# Patient Record
Sex: Female | Born: 1990 | Race: White | Hispanic: No | Smoking: Former smoker
Health system: Southern US, Community
[De-identification: ages and names within clinical notes are randomized; demographics above are authoritative.]

## PROBLEM LIST (undated history)

## (undated) DIAGNOSIS — I739 Peripheral vascular disease, unspecified: Secondary | ICD-10-CM

## (undated) HISTORY — DX: Peripheral vascular disease, unspecified: I73.9

## (undated) HISTORY — PX: TONSILLECTOMY: SUR1361

## (undated) HISTORY — PX: CHOLECYSTECTOMY: SHX55

---

## 2018-07-12 ENCOUNTER — Encounter (HOSPITAL_COMMUNITY): Payer: Self-pay

## 2018-07-12 ENCOUNTER — Emergency Department (HOSPITAL_COMMUNITY)
Admission: EM | Admit: 2018-07-12 | Discharge: 2018-07-12 | Disposition: A | Discharge status: Home / Self Care | Payer: Self-pay | Attending: Emergency Medicine | Admitting: Emergency Medicine

## 2018-07-12 DIAGNOSIS — J209 Acute bronchitis, unspecified: Secondary | ICD-10-CM | POA: Insufficient documentation

## 2018-07-12 DIAGNOSIS — F17200 Nicotine dependence, unspecified, uncomplicated: Secondary | ICD-10-CM | POA: Insufficient documentation

## 2018-07-12 MED ORDER — ALBUTEROL SULFATE HFA 108 (90 BASE) MCG/ACT IN AERS
1.0000 | INHALATION_SPRAY | Freq: Once | RESPIRATORY_TRACT | Status: AC
  Administered 2018-07-12: 2 via RESPIRATORY_TRACT
  Filled 2018-07-12: qty 6.7

## 2018-07-12 MED ORDER — DM-GUAIFENESIN ER 30-600 MG PO TB12: 1.0000 | ORAL_TABLET | Freq: Two times a day (BID) | ORAL | 0 refills | Status: DC

## 2018-07-12 MED ORDER — PREDNISONE 20 MG PO TABS: 40.0000 mg | ORAL_TABLET | Freq: Every day | ORAL | 0 refills | Status: DC

## 2018-07-12 NOTE — ED Provider Notes (Signed)
[facility] EMERGENCY DEPARTMENT Provider Note   CSN: [id number] Arrival date & time: 07/12/18  1849     History   Chief Complaint Chief Complaint  Patient presents with  . Illness    HPI Amy Decker is a 28 y.o. female who presents with a cough.  Past medical history significant for tobacco use.  Patient states that for the past week she has had URI symptoms and a fever.  The fever has resolved however she still reports a significant cough.  Today she coughed for "5 minutes straight" and could not catch her breath.  She called her mom and her mom urged her to come to the emergency department to be checked out.  She has been coughing up green sputum.  She denies ear pain, runny nose, sore throat, chest pain, shortness of breath, wheezing.  She has had bronchitis in the past and it feels similar.  She reports being prescribed promethazine for bronchitis in the past.  HPI  No past medical history on file.  There are no active problems to display for this patient.    The histories are not reviewed yet. Please review them in the "History" navigator section and refresh this SmartLink.   OB History   No obstetric history on file.      Home Medications    Prior to Admission medications   Medication Sig Start Date End Date Taking? Authorizing Provider  dextromethorphan-guaiFENesin (MUCINEX DM) 30-600 MG 12hr tablet Take 1 tablet by mouth 2 (two) times daily. 07/12/18   Bethel Born, PA-C  predniSONE (DELTASONE) 20 MG tablet Take 2 tablets (40 mg total) by mouth daily. 07/12/18   Bethel Born, PA-C    Family History No family history on file.  Social History Social History   Tobacco Use  . Smoking status: Current Every Day Smoker  . Smokeless tobacco: Never Used  Substance Use Topics  . Alcohol use: Never    Frequency: Never  . Drug use: Never     Allergies   Patient has no known allergies.   Review of Systems Review of  Systems  Constitutional: Positive for fever (Resolved).  HENT: Positive for congestion (Chest). Negative for ear pain, sinus pain and sore throat.   Respiratory: Positive for cough. Negative for shortness of breath and wheezing.   Cardiovascular: Negative for chest pain.  Gastrointestinal: Negative for vomiting.     Physical Exam Updated Vital Signs BP 112/75   Pulse 77   Temp 98.2 F (36.8 C) (Oral)   Resp 16   LMP 06/24/2018   SpO2 97%   Physical Exam Vitals signs and nursing note reviewed.  Constitutional:      General: She is not in acute distress.    Appearance: Normal appearance. She is well-developed.     Comments: Calm and cooperative.  Hoarse voice  HENT:     Head: Normocephalic and atraumatic.     Mouth/Throat:     Comments: Poor dentition Eyes:     General: No scleral icterus.       Right eye: No discharge.        Left eye: No discharge.     Conjunctiva/sclera: Conjunctivae normal.     Pupils: Pupils are equal, round, and reactive to light.  Neck:     Musculoskeletal: Normal range of motion.  Cardiovascular:     Rate and Rhythm: Normal rate and regular rhythm.  Pulmonary:     Effort: Pulmonary effort is normal. No  respiratory distress.     Breath sounds: Normal breath sounds.  Abdominal:     General: There is no distension.  Skin:    General: Skin is warm and dry.  Neurological:     Mental Status: She is alert and oriented to person, place, and time.  Psychiatric:        Behavior: Behavior normal.      ED Treatments / Results  Labs (all labs ordered are listed, but only abnormal results are displayed) Labs Reviewed - No data to display  EKG None  Radiology No results found.  Procedures Procedures (including critical care time)  Medications Ordered in ED Medications  albuterol (PROVENTIL HFA;VENTOLIN HFA) 108 (90 Base) MCG/ACT inhaler 1-2 puff (2 puffs Inhalation Given 07/12/18 1934)     Initial Impression / Assessment and Plan / ED  Course  I have reviewed the triage vital signs and the nursing notes.  Pertinent labs & imaging results that were available during my care of the patient were reviewed by me and considered in my medical decision making (see chart for details).  28 year old female presents with cough.  She states that overall she feels well other than the severe cough.  Her vital signs are normal.  On exam her heart is regular rate and rhythm.  Lungs are clear to auscultation.  I do not think she needs a chest x-ray at this time.  We will treat for viral bronchitis with steroid, cough medicine and albuterol.  She was given return precautions if she is not improving.  Final Clinical Impressions(s) / ED Diagnoses   Final diagnoses:  Acute bronchitis, unspecified organism    ED Discharge Orders         Ordered    predniSONE (DELTASONE) 20 MG tablet  Daily     07/12/18 1932    dextromethorphan-guaiFENesin (MUCINEX DM) 30-600 MG 12hr tablet  2 times daily     07/12/18 1932           Bethel BornGekas, Kelly Marie, PA-C 07/12/18 1939    Sabas SousBero, Michael M, MD 07/13/18 1036

## 2018-07-12 NOTE — Discharge Instructions (Addendum)
Use inhaler as needed for shortness of breath and wheezing Take Prednisone for the next 5 days Take cough medicine as directed Return if worsening

## 2018-07-12 NOTE — ED Notes (Signed)
Pt stable, ambulatory, states understanding of discharge instructions 

## 2018-07-12 NOTE — ED Triage Notes (Signed)
Pt c/o cough with green sputum x1 week.

## 2018-07-25 ENCOUNTER — Emergency Department (HOSPITAL_COMMUNITY): Payer: Self-pay

## 2018-07-25 ENCOUNTER — Other Ambulatory Visit: Payer: Self-pay

## 2018-07-25 ENCOUNTER — Encounter (HOSPITAL_COMMUNITY): Payer: Self-pay

## 2018-07-25 ENCOUNTER — Emergency Department (HOSPITAL_COMMUNITY)
Admission: EM | Admit: 2018-07-25 | Discharge: 2018-07-25 | Disposition: A | Discharge status: Home / Self Care | Payer: Self-pay | Attending: Emergency Medicine | Admitting: Emergency Medicine

## 2018-07-25 DIAGNOSIS — J40 Bronchitis, not specified as acute or chronic: Secondary | ICD-10-CM | POA: Insufficient documentation

## 2018-07-25 DIAGNOSIS — F17200 Nicotine dependence, unspecified, uncomplicated: Secondary | ICD-10-CM | POA: Insufficient documentation

## 2018-07-25 MED ORDER — DOXYCYCLINE HYCLATE 100 MG PO CAPS: 100.0000 mg | ORAL_CAPSULE | Freq: Two times a day (BID) | ORAL | 0 refills | Status: DC

## 2018-07-25 NOTE — ED Triage Notes (Signed)
Pt reports continued productive cough for the past 2 weeks. SOB on exertion.

## 2018-07-25 NOTE — ED Notes (Signed)
ED Provider at bedside. 

## 2018-07-25 NOTE — ED Provider Notes (Signed)
[facility] EMERGENCY DEPARTMENT Provider Note   CSN: [id number] Arrival date & time: 07/25/18  1716     History   Chief Complaint Chief Complaint  Patient presents with  . Cough    HPI Amy Decker is a 28 y.o. female.  HPI 28 year old female presents today complaining of 2 weeks of cough with production of greenish colored sputum.  She states that she was seen here 2 weeks ago and was told she had bronchitis.  She was placed on prednisone given albuterol inhaler and advised to stop smoking.  She has stopped cigarette smoking but is started vaping.  She is continued to have cough productive of greenish sputum and has some shortness of breath associated with this.  She has not had any fever or chills.  She has been eating and drinking as per usual.  She presents today for reassessment given that her symptoms have been ongoing. History reviewed. No pertinent past medical history.  There are no active problems to display for this patient.   Past Surgical History:  Procedure Laterality Date  . CHOLECYSTECTOMY    . TONSILLECTOMY       OB History   No obstetric history on file.      Home Medications    Prior to Admission medications   Medication Sig Start Date End Date Taking? Authorizing Provider  dextromethorphan-guaiFENesin (MUCINEX DM) 30-600 MG 12hr tablet Take 1 tablet by mouth 2 (two) times daily. 07/12/18   Bethel Born, PA-C  predniSONE (DELTASONE) 20 MG tablet Take 2 tablets (40 mg total) by mouth daily. 07/12/18   Bethel Born, PA-C    Family History No family history on file.  Social History Social History   Tobacco Use  . Smoking status: Current Every Day Smoker  . Smokeless tobacco: Never Used  Substance Use Topics  . Alcohol use: Never    Frequency: Never  . Drug use: Never     Allergies   Patient has no known allergies.   Review of Systems Review of Systems  All other systems reviewed and are  negative.    Physical Exam Updated Vital Signs BP 108/70 (BP Location: Right Arm)   Pulse 71   Temp 97.7 F (36.5 C) (Oral)   Resp 18   LMP 07/20/2018 (Exact Date)   SpO2 92%   Physical Exam Vitals signs and nursing note reviewed.  Constitutional:      Appearance: Normal appearance. She is normal weight.  HENT:     Head: Normocephalic and atraumatic.     Right Ear: Tympanic membrane normal.     Left Ear: Tympanic membrane normal.     Nose: Nose normal.     Mouth/Throat:     Mouth: Mucous membranes are dry.  Eyes:     Pupils: Pupils are equal, round, and reactive to light.  Neck:     Musculoskeletal: Normal range of motion.  Cardiovascular:     Rate and Rhythm: Normal rate.     Pulses: Normal pulses.  Pulmonary:     Effort: Pulmonary effort is normal.     Breath sounds: Normal breath sounds. No wheezing or rhonchi.  Abdominal:     General: Abdomen is flat. Bowel sounds are normal.  Musculoskeletal: Normal range of motion.  Skin:    General: Skin is warm and dry.  Neurological:     General: No focal deficit present.     Mental Status: She is alert.  Psychiatric:  Mood and Affect: Mood normal.      ED Treatments / Results  Labs (all labs ordered are listed, but only abnormal results are displayed) Labs Reviewed - No data to display  EKG None  Radiology Dg Chest 2 View  Result Date: 07/25/2018 CLINICAL DATA:  Cough for 10 days. EXAM: CHEST - 2 VIEW COMPARISON:  None. FINDINGS: Cardiomediastinal silhouette is normal. Mediastinal contours appear intact. There is no evidence of focal airspace consolidation, pleural effusion or pneumothorax. Osseous structures are without acute abnormality. Soft tissues are grossly normal. IMPRESSION: No active cardiopulmonary disease. Electronically Signed   By: Ted Mcalpineobrinka  Dimitrova M.D.   On: 07/25/2018 18:28    Procedures Procedures (including critical care time)  Medications Ordered in ED Medications - No data to  display   Initial Impression / Assessment and Plan / ED Course  I have reviewed the triage vital signs and the nursing notes.  Pertinent labs & imaging results that were available during my care of the patient were reviewed by me and considered in my medical decision making (see chart for details).    28 year old female with ongoing productive cough for several weeks.  Chest x-Tamari Redwine is clear.  Will treat with doxycycline. Final Clinical Impressions(s) / ED Diagnoses   Final diagnoses:  Bronchitis    ED Discharge Orders    None       Margarita Grizzleay, Amy Putz, MD 07/25/18 1843

## 2019-03-30 ENCOUNTER — Ambulatory Visit (INDEPENDENT_AMBULATORY_CARE_PROVIDER_SITE_OTHER): Payer: Medicaid Other

## 2019-03-30 VITALS — Ht 65.0 in

## 2019-03-30 DIAGNOSIS — Z3480 Encounter for supervision of other normal pregnancy, unspecified trimester: Secondary | ICD-10-CM | POA: Insufficient documentation

## 2019-03-30 MED ORDER — BLOOD PRESSURE KIT DEVI: 1.0000 | 0 refills | Status: DC

## 2019-03-30 NOTE — Progress Notes (Signed)
I connected with  Amy Decker on 03/30/19 by a video enabled telemedicine application and verified that I am speaking with the correct person using two identifiers.    Virtual Visit via Telephone Note  I connected with Amy Decker on 03/30/19 at  8:45 AM EDT by telephone and verified that I am speaking with the correct person using two identifiers.  Location: [facility] Patient: Amy Decker  I discussed the limitations, risks, security and privacy concerns of performing an evaluation and management service by telephone and the availability of in person appointments. I also discussed with the patient that there may be a patient responsible charge related to this service. The patient expressed understanding and agreed to proceed.   History of Present Illness: PRENATAL INTAKE SUMMARY  Amy Decker presents today New OB Nurse Interview.  OB History    Gravida  3   Para  2   Term  2   Preterm      AB      Living  2     SAB      TAB      Ectopic      Multiple      Live Births             I have reviewed the patient's medical, obstetrical, social, and family histories, medications, and available lab results.  SUBJECTIVE She has no unusual complaints   Observations/Objective: Initial nurse interview for history/labs (New OB)  EDD: 08/07/2019 GA: [redacted]w[redacted]d G3 P2   GENERAL APPEARANCE: alert, well appearing  Assessment and Plan: Normal pregnancy Prenatal care  Follow Up Instructions:   I discussed the assessment and treatment plan with the patient. The patient was provided an opportunity to ask questions and all were answered. The patient agreed with the plan and demonstrated an understanding of the instructions.   The patient was advised to call back or seek an in-person evaluation if the symptoms worsen or if the condition fails to improve as anticipated.  I provided 15 minutes of non-face-to-face time during this encounter.   Amy Decker,  RMA

## 2019-04-06 ENCOUNTER — Other Ambulatory Visit: Payer: Self-pay

## 2019-04-06 ENCOUNTER — Other Ambulatory Visit (HOSPITAL_COMMUNITY)
Admission: RE | Admit: 2019-04-06 | Discharge: 2019-04-06 | Disposition: A | Discharge status: Home / Self Care | Payer: Medicaid Other | Source: Ambulatory Visit | Attending: Obstetrics and Gynecology | Admitting: Obstetrics and Gynecology

## 2019-04-06 ENCOUNTER — Ambulatory Visit (INDEPENDENT_AMBULATORY_CARE_PROVIDER_SITE_OTHER): Payer: Medicaid Other | Admitting: Obstetrics and Gynecology

## 2019-04-06 ENCOUNTER — Encounter: Payer: Self-pay | Admitting: Obstetrics and Gynecology

## 2019-04-06 DIAGNOSIS — Z3480 Encounter for supervision of other normal pregnancy, unspecified trimester: Secondary | ICD-10-CM | POA: Insufficient documentation

## 2019-04-06 DIAGNOSIS — O0932 Supervision of pregnancy with insufficient antenatal care, second trimester: Secondary | ICD-10-CM | POA: Diagnosis not present

## 2019-04-06 DIAGNOSIS — O0930 Supervision of pregnancy with insufficient antenatal care, unspecified trimester: Secondary | ICD-10-CM | POA: Insufficient documentation

## 2019-04-06 DIAGNOSIS — Z3A22 22 weeks gestation of pregnancy: Secondary | ICD-10-CM

## 2019-04-06 NOTE — Patient Instructions (Signed)

## 2019-04-06 NOTE — Progress Notes (Signed)
Pt presents for NOB. Flu vaccine offered; pt declined Pap due today

## 2019-04-06 NOTE — Progress Notes (Signed)
Subjective:  Amy Decker is a 28 y.o. G3P2002 at [redacted]w[redacted]d being seen today for her first OB visit. EDD by LMP. No chronic medical problems or medications. 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    She is currently monitored for the following issues for this low-risk pregnancy and has Supervision of other normal pregnancy, antepartum and Late prenatal care affecting pregnancy on their problem list.  Patient reports no complaints.  Contractions: Not present. Vag. Bleeding: None.  Movement: Present. Denies leaking of fluid.   The following portions of the patient's history were reviewed and updated as appropriate: allergies, current medications, past family history, past medical history, past social history, past surgical history and problem list. Problem list updated.  Objective:   Vitals:   04/06/19 1449  BP: 125/70  Pulse: 88  Temp: 98.9 F (37.2 C)  Weight: 120 lb (54.4 kg)    Fetal Status: Fetal Heart Rate (bpm): 145   Movement: Present     General:  Alert, oriented and cooperative. Patient is in no acute distress.  Skin: Skin is warm and dry. No rash noted.   Cardiovascular: Normal heart rate noted  Respiratory: Normal respiratory effort, no problems with respiration noted  Abdomen: Soft, gravid, appropriate for gestational age. Pain/Pressure: Present     Pelvic:  Cervical exam performed        Extremities: Normal range of motion.  Edema: None  Mental Status: Normal mood and affect. Normal behavior. Normal judgment and thought content.  Breast sym supple no masses nipple discharge or adenopathy  Urinalysis:      Assessment and Plan:  Pregnancy: G3P2002 at [redacted]w[redacted]d  1. Supervision of other normal pregnancy, antepartum Stable Prenatal care and labs reviewed with pt Anatomy scan ordered Glucola next visit  2. Late prenatal care affecting pregnancy in second trimester   Preterm labor symptoms and general obstetric precautions including but not limited to vaginal bleeding,  contractions, leaking of fluid and fetal movement were reviewed in detail with the patient. Please refer to After Visit Summary for other counseling recommendations.  Return in about 4 weeks (around 05/04/2019) for OB visit, face to face for glucola.   Chancy Milroy, MD

## 2019-04-08 LAB — OBSTETRIC PANEL, INCLUDING HIV
Antibody Screen: NEGATIVE
Basophils Absolute: 0 10*3/uL (ref 0.0–0.2)
Basos: 0 %
EOS (ABSOLUTE): 0.1 10*3/uL (ref 0.0–0.4)
Eos: 1 %
HIV Screen 4th Generation wRfx: NONREACTIVE
Hematocrit: 31.2 % — ABNORMAL LOW (ref 34.0–46.6)
Hemoglobin: 10.7 g/dL — ABNORMAL LOW (ref 11.1–15.9)
Hepatitis B Surface Ag: NEGATIVE
Immature Grans (Abs): 0.1 10*3/uL (ref 0.0–0.1)
Immature Granulocytes: 1 %
Lymphocytes Absolute: 1.4 10*3/uL (ref 0.7–3.1)
Lymphs: 13 %
MCH: 32.4 pg (ref 26.6–33.0)
MCHC: 34.3 g/dL (ref 31.5–35.7)
MCV: 95 fL (ref 79–97)
Monocytes Absolute: 0.6 10*3/uL (ref 0.1–0.9)
Monocytes: 5 %
Neutrophils Absolute: 8.1 10*3/uL — ABNORMAL HIGH (ref 1.4–7.0)
Neutrophils: 80 %
Platelets: 171 10*3/uL (ref 150–450)
RBC: 3.3 x10E6/uL — ABNORMAL LOW (ref 3.77–5.28)
RDW: 12.5 % (ref 11.7–15.4)
RPR Ser Ql: NONREACTIVE
Rh Factor: POSITIVE
Rubella Antibodies, IGG: 1.61 index (ref >0.99)
WBC: 10.3 10*3/uL (ref 3.4–10.8)

## 2019-04-08 LAB — URINE CULTURE, OB REFLEX: Organism ID, Bacteria: NO GROWTH

## 2019-04-08 LAB — CULTURE, OB URINE

## 2019-04-13 ENCOUNTER — Encounter: Payer: Self-pay | Admitting: Obstetrics and Gynecology

## 2019-04-13 ENCOUNTER — Other Ambulatory Visit: Payer: Self-pay | Admitting: Obstetrics and Gynecology

## 2019-04-13 ENCOUNTER — Other Ambulatory Visit: Payer: Self-pay

## 2019-04-13 ENCOUNTER — Ambulatory Visit (HOSPITAL_COMMUNITY)
Admission: RE | Admit: 2019-04-13 | Discharge: 2019-04-13 | Disposition: A | Discharge status: Home / Self Care | Payer: Medicaid Other | Source: Ambulatory Visit | Attending: Obstetrics and Gynecology | Admitting: Obstetrics and Gynecology

## 2019-04-13 DIAGNOSIS — Z3A23 23 weeks gestation of pregnancy: Secondary | ICD-10-CM | POA: Diagnosis not present

## 2019-04-13 DIAGNOSIS — Z3480 Encounter for supervision of other normal pregnancy, unspecified trimester: Secondary | ICD-10-CM | POA: Insufficient documentation

## 2019-04-13 DIAGNOSIS — O0932 Supervision of pregnancy with insufficient antenatal care, second trimester: Secondary | ICD-10-CM

## 2019-04-13 DIAGNOSIS — O43192 Other malformation of placenta, second trimester: Secondary | ICD-10-CM

## 2019-04-13 LAB — CYTOLOGY - PAP
Adequacy: ABSENT
Diagnosis: NEGATIVE
High risk HPV: NEGATIVE

## 2019-04-15 LAB — CERVICOVAGINAL ANCILLARY ONLY
Bacterial Vaginitis (gardnerella): NEGATIVE
Candida Glabrata: NEGATIVE
Candida Vaginitis: NEGATIVE
Chlamydia: NEGATIVE
Comment: NEGATIVE
Comment: NEGATIVE
Comment: NEGATIVE
Comment: NEGATIVE
Comment: NEGATIVE
Comment: NORMAL
Neisseria Gonorrhea: NEGATIVE
Trichomonas: NEGATIVE

## 2019-04-28 ENCOUNTER — Encounter: Payer: Self-pay | Admitting: Obstetrics and Gynecology

## 2019-05-03 ENCOUNTER — Other Ambulatory Visit: Payer: Self-pay

## 2019-05-03 ENCOUNTER — Emergency Department (HOSPITAL_COMMUNITY)
Admission: EM | Admit: 2019-05-03 | Discharge: 2019-05-03 | Disposition: A | Discharge status: Home / Self Care | Payer: Medicaid Other | Attending: Emergency Medicine | Admitting: Emergency Medicine

## 2019-05-03 ENCOUNTER — Encounter (HOSPITAL_COMMUNITY): Payer: Self-pay | Admitting: Emergency Medicine

## 2019-05-03 DIAGNOSIS — Z79899 Other long term (current) drug therapy: Secondary | ICD-10-CM | POA: Insufficient documentation

## 2019-05-03 DIAGNOSIS — F17210 Nicotine dependence, cigarettes, uncomplicated: Secondary | ICD-10-CM | POA: Insufficient documentation

## 2019-05-03 DIAGNOSIS — K0889 Other specified disorders of teeth and supporting structures: Secondary | ICD-10-CM | POA: Insufficient documentation

## 2019-05-03 MED ORDER — PENICILLIN V POTASSIUM 500 MG PO TABS: 500.0000 mg | ORAL_TABLET | Freq: Four times a day (QID) | ORAL | 0 refills | Status: AC

## 2019-05-03 NOTE — ED Notes (Signed)
PT refused last vitals  Patient verbalizes understanding of discharge instructions. Opportunity for questioning and answers were provided. Armband removed by staff, pt discharged from ED

## 2019-05-03 NOTE — ED Triage Notes (Signed)
Pt states she has terrible dental pain since yesterday. But cannot tell me which tooth. Just states its all over the right side. Pt states she has "bad teeth."

## 2019-05-03 NOTE — Discharge Instructions (Signed)
You were given a prescription for antibiotics. Please take the antibiotic prescription fully.   Please follow-up with a dentist in the next 5 to 7 days for reevaluation.  If you do not have a dentist, resources were provided for dentist in the area in your discharge summary.  Please contact one of the offices that are listed and make an appointment for follow-up.  Please return to the emergency department for any new or worsening symptoms.  

## 2019-05-03 NOTE — ED Provider Notes (Signed)
[place] EMERGENCY DEPARTMENT Provider Note   CSN: [id number] Arrival date & time: 05/03/19  1222     History   Chief Complaint Chief Complaint  Patient presents with  . Dental Pain    HPI Amy Decker is a 28 y.o. female.     HPI   Patient is a 28 year old female who presents the emergency department today for evaluation of right upper dental pain that started yesterday.  Pain is constant and severe in nature.  Denies exacerbating or alleviating factors.  She has not had a fever.  Past Medical History:  Diagnosis Date  . Peripheral vascular disease [facility])     Patient Active Problem List   Diagnosis Date Noted  . Late prenatal care affecting pregnancy 04/06/2019  . Supervision of other normal pregnancy, antepartum 03/30/2019    Past Surgical History:  Procedure Laterality Date  . CHOLECYSTECTOMY    . TONSILLECTOMY       OB History    Gravida  3   Para  2   Term  2   Preterm      AB      Living  2     SAB      TAB      Ectopic      Multiple      Live Births  2            Home Medications    Prior to Admission medications   Medication Sig Start Date End Date Taking? Authorizing Provider  Blood Pressure Monitoring (BLOOD PRESSURE KIT) DEVI 1 kit by Does not apply route once a week. Check BP regularly. Large Cuff Patient not taking: Reported on 04/06/2019 03/30/19   Shelly Bombard, MD  dextromethorphan-guaiFENesin [facility] DM) 30-600 MG 12hr tablet Take 1 tablet by mouth 2 (two) times daily. Patient not taking: Reported on 03/30/2019 07/12/18   Recardo Evangelist, PA-C  omeprazole (PRILOSEC) 10 MG capsule Take 10 mg by mouth daily.    [provider]  penicillin v potassium (VEETID) 500 MG tablet Take 1 tablet (500 mg total) by mouth 4 (four) times daily for 7 days. 05/03/19 05/10/19  Patches Mcdonnell S, PA-C  predniSONE (DELTASONE) 20 MG tablet Take 2 tablets (40 mg total) by mouth daily. Patient not  taking: Reported on 03/30/2019 07/12/18   Recardo Evangelist, PA-C    Family History Family History  Problem Relation Age of Onset  . Arthritis Mother   . Cancer Mother   . Hypertension Maternal Grandfather     Social History Social History   Tobacco Use  . Smoking status: Current Every Day Smoker    Types: Cigarettes  . Smokeless tobacco: Never Used  Substance Use Topics  . Alcohol use: Never    Frequency: Never  . Drug use: Never     Allergies   Patient has no known allergies.   Review of Systems Review of Systems  Constitutional: Negative for fever.  HENT: Positive for dental problem.      Physical Exam Updated Vital Signs BP 109/68 (BP Location: Right Arm)   Temp 98.2 F (36.8 C) (Oral)   Resp 14   LMP 10/31/2018 (Approximate)   SpO2 98%   Physical Exam Vitals signs and nursing note reviewed.  Constitutional:      General: She is not in acute distress.    Appearance: She is well-developed.  HENT:     Head: Normocephalic and atraumatic.     Mouth/Throat:  Comments: Multiple missing teeth and dental caries.  Right upper molar is fractured and tender to percussion.  There is no periapical abscess.  No sublingual swelling.  No submandibular swelling.  No trismus. Eyes:     Conjunctiva/sclera: Conjunctivae normal.  Neck:     Musculoskeletal: Neck supple.  Cardiovascular:     Rate and Rhythm: Normal rate.  Pulmonary:     Effort: Pulmonary effort is normal.  Musculoskeletal: Normal range of motion.  Skin:    General: Skin is warm and dry.  Neurological:     Mental Status: She is alert.      ED Treatments / Results  Labs (all labs ordered are listed, but only abnormal results are displayed) Labs Reviewed - No data to display  EKG None  Radiology No results found.  Procedures Procedures (including critical care time)  Medications Ordered in ED Medications - No data to display   Initial Impression / Assessment and Plan / ED Course   I have reviewed the triage vital signs and the nursing notes.  Pertinent labs & imaging results that were available during my care of the patient were reviewed by me and considered in my medical decision making (see chart for details).    Final Clinical Impressions(s) / ED Diagnoses   Final diagnoses:  Pain, dental   Patient with toothache.  No gross abscess.  Exam unconcerning for Ludwig's angina or spread of infection.  Will treat with penicillin and pain medicine.  Urged patient to follow-up with dentist.     ED Discharge Orders         Ordered    penicillin v potassium (VEETID) 500 MG tablet  4 times daily     05/03/19 [address] [place], PA-C 05/03/19 1415    Charlesetta Shanks, MD 05/04/19 1620

## 2019-05-04 ENCOUNTER — Encounter: Payer: Medicaid Other | Admitting: Obstetrics and Gynecology

## 2019-05-04 ENCOUNTER — Other Ambulatory Visit: Payer: Medicaid Other

## 2019-05-07 ENCOUNTER — Other Ambulatory Visit: Payer: Self-pay

## 2019-05-07 ENCOUNTER — Encounter: Payer: Self-pay | Admitting: Obstetrics

## 2019-05-07 ENCOUNTER — Other Ambulatory Visit: Payer: Medicaid Other

## 2019-05-07 ENCOUNTER — Encounter: Payer: Self-pay | Admitting: Women's Health

## 2019-05-07 ENCOUNTER — Ambulatory Visit (INDEPENDENT_AMBULATORY_CARE_PROVIDER_SITE_OTHER): Payer: Medicaid Other | Admitting: Women's Health

## 2019-05-07 DIAGNOSIS — O43192 Other malformation of placenta, second trimester: Secondary | ICD-10-CM

## 2019-05-07 DIAGNOSIS — Z3480 Encounter for supervision of other normal pregnancy, unspecified trimester: Secondary | ICD-10-CM

## 2019-05-07 DIAGNOSIS — Z3A26 26 weeks gestation of pregnancy: Secondary | ICD-10-CM

## 2019-05-07 DIAGNOSIS — O43199 Other malformation of placenta, unspecified trimester: Secondary | ICD-10-CM | POA: Insufficient documentation

## 2019-05-07 NOTE — Progress Notes (Signed)
Subjective:  Amy Decker is a 28 y.o. G3P2002 at [redacted]w[redacted]d being seen today for ongoing prenatal care.  She is currently monitored for the following issues for this low-risk pregnancy and has Supervision of other normal pregnancy, antepartum; Late prenatal care affecting pregnancy; and Marginal insertion of umbilical cord affecting management of mother on their problem list.  Patient reports no complaints.  Contractions: Not present. Vag. Bleeding: None.  Movement: Present. Denies leaking of fluid.   The following portions of the patient's history were reviewed and updated as appropriate: allergies, current medications, past family history, past medical history, past social history, past surgical history and problem list. Problem list updated.  Objective:   Vitals:   05/07/19 0813  BP: 102/63  Pulse: 94  Weight: 121 lb (54.9 kg)    Fetal Status: Fetal Heart Rate (bpm): 138 Fundal Height: 26 cm Movement: Present     General:  Alert, oriented and cooperative. Patient is in no acute distress.  Skin: Skin is warm and dry. No rash noted.   Cardiovascular: Normal heart rate noted  Respiratory: Normal respiratory effort, no problems with respiration noted  Abdomen: Soft, gravid, appropriate for gestational age. Pain/Pressure: Absent     Pelvic: Vag. Bleeding: None     Cervical exam deferred        Extremities: Normal range of motion.     Mental Status: Normal mood and affect. Normal behavior. Normal judgment and thought content.   Assessment and Plan:  Pregnancy: G3P2002 at [redacted]w[redacted]d  1. Supervision of other normal pregnancy, antepartum - pt not fasting for 2hr GTT this morning, will reschedule ASAP next week for labs/glucose/Tdap - marginal cord insertion found on anatomy scan, f/u scan 05/12/2019, pt aware  Preterm labor symptoms and general obstetric precautions including but not limited to vaginal bleeding, contractions, leaking of fluid and fetal movement were reviewed in detail  with the patient. Please refer to After Visit Summary for other counseling recommendations.  Return in about 1 week (around 05/14/2019) for GTT/labs/Tdap ASAP, 2 weeks virtual ROB.   Earnesteen Birnie, Gerrie Nordmann, NP

## 2019-05-07 NOTE — Patient Instructions (Addendum)
Preterm Labor and Birth Information ° °The normal length of a pregnancy is 39-41 weeks. Preterm labor is when labor starts before 37 completed weeks of pregnancy. °What are the risk factors for preterm labor? °Preterm labor is more likely to occur in women who: °· Have certain infections during pregnancy such as a bladder infection, sexually transmitted infection, or infection inside the uterus (chorioamnionitis). °· Have a shorter-than-normal cervix. °· Have gone into preterm labor before. °· Have had surgery on their cervix. °· Are younger than age 17 or older than age 35. °· Are African American. °· Are pregnant with twins or multiple babies (multiple gestation). °· Take street drugs or smoke while pregnant. °· Do not gain enough weight while pregnant. °· Became pregnant shortly after having been pregnant. °What are the symptoms of preterm labor? °Symptoms of preterm labor include: °· Cramps similar to those that can happen during a menstrual period. The cramps may happen with diarrhea. °· Pain in the abdomen or lower back. °· Regular uterine contractions that may feel like tightening of the abdomen. °· A feeling of increased pressure in the pelvis. °· Increased watery or bloody mucus discharge from the vagina. °· Water breaking (ruptured amniotic sac). °Why is it important to recognize signs of preterm labor? °It is important to recognize signs of preterm labor because babies who are born prematurely may not be fully developed. This can put them at an increased risk for: °· Long-term (chronic) heart and lung problems. °· Difficulty immediately after birth with regulating body systems, including blood sugar, body temperature, heart rate, and breathing rate. °· Bleeding in the brain. °· Cerebral palsy. °· Learning difficulties. °· Death. °These risks are highest for babies who are born before 34 weeks of pregnancy. °How is preterm labor treated? °Treatment depends on the length of your pregnancy, your condition,  and the health of your baby. It may involve: °· Having a stitch (suture) placed in your cervix to prevent your cervix from opening too early (cerclage). °· Taking or being given medicines, such as: °? Hormone medicines. These may be given early in pregnancy to help support the pregnancy. °? Medicine to stop contractions. °? Medicines to help mature the baby’s lungs. These may be prescribed if the risk of delivery is high. °? Medicines to prevent your baby from developing cerebral palsy. °If the labor happens before 34 weeks of pregnancy, you may need to stay in the hospital. °What should I do if I think I am in preterm labor? °If you think that you are going into preterm labor, call your health care provider right away. °How can I prevent preterm labor in future pregnancies? °To increase your chance of having a full-term pregnancy: °· Do not use any tobacco products, such as cigarettes, chewing tobacco, and e-cigarettes. If you need help quitting, ask your health care provider. °· Do not use street drugs or medicines that have not been prescribed to you during your pregnancy. °· Talk with your health care provider before taking any herbal supplements, even if you have been taking them regularly. °· Make sure you gain a healthy amount of weight during your pregnancy. °· Watch for infection. If you think that you might have an infection, get it checked right away. °· Make sure to tell your health care provider if you have gone into preterm labor before. °This information is not intended to replace advice given to you by your health care provider. Make sure you discuss any questions you have with your   health care provider. Document Released: 09/07/2003 Document Revised: 10/09/2018 Document Reviewed: 11/08/2015 Elsevier Patient Education  2020 [employer]. Third Trimester of Pregnancy The third trimester is from week 28 through week 40 (months 7 through 9). The third trimester is a time when the unborn baby  (fetus) is growing rapidly. At the end of the ninth month, the fetus is about 20 inches in length and weighs 6-10 pounds. Body changes during your third trimester Your body will continue to go through many changes during pregnancy. The changes vary from woman to woman. During the third trimester:  Your weight will continue to increase. You can expect to gain 25-35 pounds (11-16 kg) by the end of the pregnancy.  You may begin to get stretch marks on your hips, abdomen, and breasts.  You may urinate more often because the fetus is moving lower into your pelvis and pressing on your bladder.  You may develop or continue to have heartburn. This is caused by increased hormones that slow down muscles in the digestive tract.  You may develop or continue to have constipation because increased hormones slow digestion and cause the muscles that push waste through your intestines to relax.  You may develop hemorrhoids. These are swollen veins (varicose veins) in the rectum that can itch or be painful.  You may develop swollen, bulging veins (varicose veins) in your legs.  You may have increased body aches in the pelvis, back, or thighs. This is due to weight gain and increased hormones that are relaxing your joints.  You may have changes in your hair. These can include thickening of your hair, rapid growth, and changes in texture. Some women also have hair loss during or after pregnancy, or hair that feels dry or thin. Your hair will most likely return to normal after your baby is born.  Your breasts will continue to grow and they will continue to become tender. A yellow fluid (colostrum) may leak from your breasts. This is the first milk you are producing for your baby.  Your belly button may stick out.  You may notice more swelling in your hands, face, or ankles.  You may have increased tingling or numbness in your hands, arms, and legs. The skin on your belly may also feel numb.  You may feel  short of breath because of your expanding uterus.  You may have more problems sleeping. This can be caused by the size of your belly, increased need to urinate, and an increase in your body's metabolism.  You may notice the fetus "dropping," or moving lower in your abdomen (lightening).  You may have increased vaginal discharge.  You may notice your joints feel loose and you may have pain around your pelvic bone. What to expect at prenatal visits You will have prenatal exams every 2 weeks until week 36. Then you will have weekly prenatal exams. During a routine prenatal visit:  You will be weighed to make sure you and the baby are growing normally.  Your blood pressure will be taken.  Your abdomen will be measured to track your baby's growth.  The fetal heartbeat will be listened to.  Any test results from the previous visit will be discussed.  You may have a cervical check near your due date to see if your cervix has softened or thinned (effaced).  You will be tested for Group B streptococcus. This happens between 35 and 37 weeks. Your health care provider may ask you:  What your birth plan is.  How you are feeling.  If you are feeling the baby move.  If you have had any abnormal symptoms, such as leaking fluid, bleeding, severe headaches, or abdominal cramping.  If you are using any tobacco products, including cigarettes, chewing tobacco, and electronic cigarettes.  If you have any questions. Other tests or screenings that may be performed during your third trimester include:  Blood tests that check for low iron levels (anemia).  Fetal testing to check the health, activity level, and growth of the fetus. Testing is done if you have certain medical conditions or if there are problems during the pregnancy.  Nonstress test (NST). This test checks the health of your baby to make sure there are no signs of problems, such as the baby not getting enough oxygen. During this  test, a belt is placed around your belly. The baby is made to move, and its heart rate is monitored during movement. What is false labor? False labor is a condition in which you feel small, irregular tightenings of the muscles in the womb (contractions) that usually go away with rest, changing position, or drinking water. These are called Braxton Hicks contractions. Contractions may last for hours, days, or even weeks before true labor sets in. If contractions come at regular intervals, become more frequent, increase in intensity, or become painful, you should see your health care provider. What are the signs of labor?  Abdominal cramps.  Regular contractions that start at 10 minutes apart and become stronger and more frequent with time.  Contractions that start on the top of the uterus and spread down to the lower abdomen and back.  Increased pelvic pressure and dull back pain.  A watery or bloody mucus discharge that comes from the vagina.  Leaking of amniotic fluid. This is also known as your "water breaking." It could be a slow trickle or a gush. Let your health care provider know if it has a color or strange odor. If you have any of these signs, call your health care provider right away, even if it is before your due date. Follow these instructions at home: Medicines  Follow your health care provider's instructions regarding medicine use. Specific medicines may be either safe or unsafe to take during pregnancy.  Take a prenatal vitamin that contains at least 600 micrograms (mcg) of folic acid.  If you develop constipation, try taking a stool softener if your health care provider approves. Eating and drinking   Eat a balanced diet that includes fresh fruits and vegetables, whole grains, good sources of protein such as meat, eggs, or tofu, and low-fat dairy. Your health care provider will help you determine the amount of weight gain that is right for you.  Avoid raw meat and uncooked  cheese. These carry germs that can cause birth defects in the baby.  If you have low calcium intake from food, talk to your health care provider about whether you should take a daily calcium supplement.  Eat four or five small meals rather than three large meals a day.  Limit foods that are high in fat and processed sugars, such as fried and sweet foods.  To prevent constipation: ? Drink enough fluid to keep your urine clear or pale yellow. ? Eat foods that are high in fiber, such as fresh fruits and vegetables, whole grains, and beans. Activity  Exercise only as directed by your health care provider. Most women can continue their usual exercise routine during pregnancy. Try to exercise for 30 minutes  at least 5 days a week. Stop exercising if you experience uterine contractions.  Avoid heavy lifting.  Do not exercise in extreme heat or humidity, or at high altitudes.  Wear low-heel, comfortable shoes.  Practice good posture.  You may continue to have sex unless your health care provider tells you otherwise. Relieving pain and discomfort  Take frequent breaks and rest with your legs elevated if you have leg cramps or low back pain.  Take warm sitz baths to soothe any pain or discomfort caused by hemorrhoids. Use hemorrhoid cream if your health care provider approves.  Wear a good support bra to prevent discomfort from breast tenderness.  If you develop varicose veins: ? Wear support pantyhose or compression stockings as told by your healthcare provider. ? Elevate your feet for 15 minutes, 3-4 times a day. Prenatal care  Write down your questions. Take them to your prenatal visits.  Keep all your prenatal visits as told by your health care provider. This is important. Safety  Wear your seat belt at all times when driving.  Make a list of emergency phone numbers, including numbers for family, friends, the hospital, and police and fire departments. General  instructions  Avoid cat litter boxes and soil used by cats. These carry germs that can cause birth defects in the baby. If you have a cat, ask someone to clean the litter box for you.  Do not travel far distances unless it is absolutely necessary and only with the approval of your health care provider.  Do not use hot tubs, steam rooms, or saunas.  Do not drink alcohol.  Do not use any products that contain nicotine or tobacco, such as cigarettes and e-cigarettes. If you need help quitting, ask your health care provider.  Do not use any medicinal herbs or unprescribed drugs. These chemicals affect the formation and growth of the baby.  Do not douche or use tampons or scented sanitary pads.  Do not cross your legs for long periods of time.  To prepare for the arrival of your baby: ? Take prenatal classes to understand, practice, and ask questions about labor and delivery. ? Make a trial run to the hospital. ? Visit the hospital and tour the maternity area. ? Arrange for maternity or paternity leave through employers. ? Arrange for family and friends to take care of pets while you are in the hospital. ? Purchase a rear-facing car seat and make sure you know how to install it in your car. ? Pack your hospital bag. ? Prepare the babys nursery. Make sure to remove all pillows and stuffed animals from the baby's crib to prevent suffocation.  Visit your dentist if you have not gone during your pregnancy. Use a soft toothbrush to brush your teeth and be gentle when you floss. Contact a health care provider if:  You are unsure if you are in labor or if your water has broken.  You become dizzy.  You have mild pelvic cramps, pelvic pressure, or nagging pain in your abdominal area.  You have lower back pain.  You have persistent nausea, vomiting, or diarrhea.  You have an unusual or bad smelling vaginal discharge.  You have pain when you urinate. Get help right away if:  Your water  breaks before 37 weeks.  You have regular contractions less than 5 minutes apart before 37 weeks.  You have a fever.  You are leaking fluid from your vagina.  You have spotting or bleeding from your vagina.  You have severe abdominal pain or cramping.  You have rapid weight loss or weight gain.  You have shortness of breath with chest pain.  You notice sudden or extreme swelling of your face, hands, ankles, feet, or legs.  Your baby makes fewer than 10 movements in 2 hours.  You have severe headaches that do not go away when you take medicine.  You have vision changes. Summary  The third trimester is from week 28 through week 40, months 7 through 9. The third trimester is a time when the unborn baby (fetus) is growing rapidly.  During the third trimester, your discomfort may increase as you and your baby continue to gain weight. You may have abdominal, leg, and back pain, sleeping problems, and an increased need to urinate.  During the third trimester your breasts will keep growing and they will continue to become tender. A yellow fluid (colostrum) may leak from your breasts. This is the first milk you are producing for your baby.  False labor is a condition in which you feel small, irregular tightenings of the muscles in the womb (contractions) that eventually go away. These are called Braxton Hicks contractions. Contractions may last for hours, days, or even weeks before true labor sets in.  Signs of labor can include: abdominal cramps; regular contractions that start at 10 minutes apart and become stronger and more frequent with time; watery or bloody mucus discharge that comes from the vagina; increased pelvic pressure and dull back pain; and leaking of amniotic fluid. This information is not intended to replace advice given to you by your health care provider. Make sure you discuss any questions you have with your health care provider. Document Released: 06/11/2001 Document  Revised: 10/08/2018 Document Reviewed: 07/23/2016 Elsevier Patient Education  2020 [employer] Glucose Tolerance Test During Pregnancy Why am I having this test? The glucose tolerance test (GTT) is done to check how your body processes sugar (glucose). This is one of several tests used to diagnose diabetes that develops during pregnancy (gestational diabetes mellitus). Gestational diabetes is a temporary form of diabetes that some women develop during pregnancy. It usually occurs during the second trimester of pregnancy and goes away after delivery. Testing (screening) for gestational diabetes usually occurs between 24 and 28 weeks of pregnancy. You may have the GTT test after having a 1-hour glucose screening test if the results from that test indicate that you may have gestational diabetes. You may also have this test if:  You have a history of gestational diabetes.  You have a history of giving birth to very large babies or have experienced repeated fetal loss (stillbirth).  You have signs and symptoms of diabetes, such as: ? Changes in your vision. ? Tingling or numbness in your hands or feet. ? Changes in hunger, thirst, and urination that are not otherwise explained by your pregnancy. What is being tested? This test measures the amount of glucose in your blood at different times during a period of 3 hours. This indicates how well your body is able to process glucose. What kind of sample is taken?  Blood samples are required for this test. They are usually collected by inserting a needle into a blood vessel. How do I prepare for this test?  For 3 days before your test, eat normally. Have plenty of carbohydrate-rich foods.  Follow instructions from your health care provider about: ? Eating or drinking restrictions on the day of the test. You may be asked to not eat or  drink anything other than water (fast) starting 8-10 hours before the test. ? Changing or stopping your regular  medicines. Some medicines may interfere with this test. Tell a health care provider about:  All medicines you are taking, including vitamins, herbs, eye drops, creams, and over-the-counter medicines.  Any blood disorders you have.  Any surgeries you have had.  Any medical conditions you have. What happens during the test? First, your blood glucose will be measured. This is referred to as your fasting blood glucose, since you fasted before the test. Then, you will drink a glucose solution that contains a certain amount of glucose. Your blood glucose will be measured again 1, 2, and 3 hours after drinking the solution. This test takes about 3 hours to complete. You will need to stay at the testing location during this time. During the testing period:  Do not eat or drink anything other than the glucose solution.  Do not exercise.  Do not use any products that contain nicotine or tobacco, such as cigarettes and e-cigarettes. If you need help stopping, ask your health care provider. The testing procedure may vary among health care providers and hospitals. How are the results reported? Your results will be reported as milligrams of glucose per deciliter of blood (mg/dL) or millimoles per liter (mmol/L). Your health care provider will compare your results to normal ranges that were established after testing a large group of people (reference ranges). Reference ranges may vary among labs and hospitals. For this test, common reference ranges are:  Fasting: less than 95-105 mg/dL (1.6-1.0 mmol/L).  1 hour after drinking glucose: less than 180-190 mg/dL (96.0-45.4 mmol/L).  2 hours after drinking glucose: less than 155-165 mg/dL (0.9-8.1 mmol/L).  3 hours after drinking glucose: 140-145 mg/dL (1.9-1.4 mmol/L). What do the results mean? Results within reference ranges are considered normal, meaning that your glucose levels are well-controlled. If two or more of your blood glucose levels are high,  you may be diagnosed with gestational diabetes. If only one level is high, your health care provider may suggest repeat testing or other tests to confirm a diagnosis. Talk with your health care provider about what your results mean. Questions to ask your health care provider Ask your health care provider, or the department that is doing the test:  When will my results be ready?  How will I get my results?  What are my treatment options?  What other tests do I need?  What are my next steps? Summary  The glucose tolerance test (GTT) is one of several tests used to diagnose diabetes that develops during pregnancy (gestational diabetes mellitus). Gestational diabetes is a temporary form of diabetes that some women develop during pregnancy.  You may have the GTT test after having a 1-hour glucose screening test if the results from that test indicate that you may have gestational diabetes. You may also have this test if you have any symptoms or risk factors for gestational diabetes.  Talk with your health care provider about what your results mean. This information is not intended to replace advice given to you by your health care provider. Make sure you discuss any questions you have with your health care provider. Document Released: 12/17/2011 Document Revised: 10/08/2018 Document Reviewed: 01/27/2017 Elsevier Patient Education  2020 [employer] Inc[employer]   AREA PEDIATRIC/FAMILY PRACTICE PHYSICIANS  [facility] [address] [place], [place] [address] Phone - [phone]   Fax - [phone]  JACK AMOS [address]  [place], [place]  [address] Phone - [phone]   Fax - [phone]  [facility] [address] [place], [place]  [address] Phone - [phone]   Fax - [phone]  [facility] [address] [place], [place]  [address] Phone - [phone]   Fax - [phone]  [facility] [address] [place], [place]  [address] Phone - [phone]   Fax - [phone]  [facility] [address] [place], [place]  [address] Phone - [phone]   Fax - [phone]  [facility] [place] [address] [place], [place]  [address] Phone - [phone]   Fax - [phone]  [facility] [address] [place], [address] [place], [place]  [address] Phone - [phone]   Fax - [phone]  [facility] [address] [place], [place]  [address] Phone - [phone]   Fax - [phone] [facility] [place] [address] [place], [place]  [address] Phone - [phone]   Fax - [phone]  [facility] [address] [place], [place]  [address] Phone - [phone]   Fax - [phone]  [facility] [address] [place], [place]  [address] Phone - [phone]   Fax - [phone]  [facility] [place] [address] [place], [place]  [address] Phone - [phone]   Fax - [phone]  [facility] [address] [place], [place]  [address] Phone - [phone]  [facility] [address] [place], [place]  [address] Phone - [phone]   Fax - [phone]  [facility] [address] [place], [place]  [address] Phone - [phone]   Fax - [phone]  [facility] [address] [place], [place]  [address] Phone - [phone]   Fax - [phone]  [facility] [address] [place], [place]  [address] Phone - [phone]   Fax - [phone]   [facility] [address] [place], [place]  [address] Phone - [phone]   Fax - [phone]  [facility] [address] [place], [place]  [address] Phone - [phone]   Fax -  [phone]  DAVID RUBIN [address] [place], [place]  [address] Phone - [phone]   Fax - [phone]  [facility] [address] [place], [place]  [address] Phone - [phone]   Fax - [phone]  [place] [facility] Al[facility] Chyle [address] [place], [place]  [address] Phone - [phone]   Fax - [phone] Gerarda Fraction [address] [place], [place]  [address] Phone - [phone]   Fax - [phone]  [facility] [address] [place], [place]  [address] Phone - [phone]   Fax - [phone]  [facility] MEDICINE - [place] [address], [address] [place], [place]  [address] Phone - [phone]   Fax - [phone]   https://www.cdc.gov/vaccines/hcp/vis/vis-statements/tdap.pdf">  Tdap Vaccine (Tetanus, Diphtheria and Pertussis): What You Need to Know 1. Why get vaccinated? Tetanus, diphtheria and pertussis are very serious diseases. Tdap vaccine can protect [place] from these diseases. And, Tdap vaccine given to pregnant women can protect newborn babies against pertussis.Marland Kitchen TETANUS (Lockjaw) is rare in the [place] today. It causes painful muscle tightening and stiffness, usually all over  the body.  It can lead to tightening of muscles in the head and neck so you can't open your mouth, swallow, or sometimes even breathe. Tetanus kills about 1 out of 10 people who are infected even after receiving the best medical care. DIPHTHERIA is also rare in the [place] today. It can cause a thick coating to form in the back of the throat.  It can lead to breathing problems, heart failure, paralysis, and death. PERTUSSIS (Whooping Cough) causes severe coughing spells, which can cause difficulty breathing, vomiting and disturbed sleep.  It can also lead to weight loss, incontinence, and rib fractures. Up to 2 in 100 adolescents and 5 in 100 adults with pertussis are hospitalized or have  complications, which could include pneumonia or death. These diseases are caused by bacteria. Diphtheria and pertussis are spread from person to person through secretions from coughing or sneezing. Tetanus enters the body through cuts, scratches, or wounds. Before vaccines, as many as 200,000 cases of diphtheria, 200,000 cases of pertussis, and hundreds of cases of tetanus, were reported in the [place] each year. Since vaccination began, reports of cases for tetanus and diphtheria have dropped by about 99% and for pertussis by about 80%. 2. Tdap vaccine Tdap vaccine can protect adolescents and adults from tetanus, diphtheria, and pertussis. One dose of Tdap is routinely given at age 24 or 70. People who did not get Tdap at that age should get it as soon as possible. Tdap is especially important for healthcare professionals and anyone having close contact with a baby younger than 12 months. Pregnant women should get a dose of Tdap during every pregnancy, to protect the newborn from pertussis. Infants are most at risk for severe, life-threatening complications from pertussis. Another vaccine, called Td, protects against tetanus and diphtheria, but not pertussis. A Td booster should be given every 10 years. Tdap may be given as one of these boosters if you have never gotten Tdap before. Tdap may also be given after a severe cut or burn to prevent tetanus infection. Your doctor or the person giving you the vaccine can give you more information. Tdap may safely be given at the same time as other vaccines. 3. Some people should not get this vaccine  A person who has ever had a life-threatening allergic reaction after a previous dose of any diphtheria, tetanus or pertussis containing vaccine, OR has a severe allergy to any part of this vaccine, should not get Tdap vaccine. Tell the person giving the vaccine about any severe allergies.  Anyone who had coma or long repeated seizures within 7 days after a  childhood dose of DTP or DTaP, or a previous dose of Tdap, should not get Tdap, unless a cause other than the vaccine was found. They can still get Td.  Talk to your doctor if you: ? have seizures or another nervous system problem, ? had severe pain or swelling after any vaccine containing diphtheria, tetanus or pertussis, ? ever had a condition called Guillain-Barr Syndrome (GBS), ? aren't feeling well on the day the shot is scheduled. 4. Risks With any medicine, including vaccines, there is a chance of side effects. These are usually mild and go away on their own. Serious reactions are also possible but are rare. Most people who get Tdap vaccine do not have any problems with it. Mild problems following Tdap (Did not interfere with activities)  Pain where the shot was given (about 3 in 4 adolescents or 2 in 3 adults)  Redness or swelling where the shot was given (about 1 person in 5)  Mild fever of at least 100.49F (up to about 1 in 25 adolescents or 1 in 100 adults)  Headache (about 3 or 4 people in 10)  Tiredness (about 1 person in 3 or 4)  Nausea, vomiting, diarrhea, stomach ache (up to 1 in 4 adolescents or 1 in 10 adults)  Chills, sore joints (about 1 person in 10)  Body aches (about 1 person in 3 or 4)  Rash, swollen glands (uncommon) Moderate problems following Tdap (Interfered with activities, but did not require medical attention)  Pain where the shot was given (up to 1 in 5 or 6)  Redness or swelling where the shot was given (up to about 1 in 16 adolescents or 1 in 12 adults)  Fever over 102F (about 1 in 100 adolescents or 1 in 250 adults)  Headache (about 1 in 7 adolescents or 1 in 10 adults)  Nausea, vomiting, diarrhea, stomach ache (up to 1 or 3 people in 100)  Swelling of the entire arm where the shot was given (up to about 1 in 500). Severe problems following Tdap (Unable to perform usual activities; required medical attention)  Swelling, severe pain,  bleeding and redness in the arm where the shot was given (rare). Problems that could happen after any vaccine:  People sometimes faint after a medical procedure, including vaccination. Sitting or lying down for about 15 minutes can help prevent fainting, and injuries caused by a fall. Tell your doctor if you feel dizzy, or have vision changes or ringing in the ears.  Some people get severe pain in the shoulder and have difficulty moving the arm where a shot was given. This happens very rarely.  Any medication can cause a severe allergic reaction. Such reactions from a vaccine are very rare, estimated at fewer than 1 in a million doses, and would happen within a few minutes to a few hours after the vaccination. As with any medicine, there is a very remote chance of a vaccine causing a serious injury or death. The safety of vaccines is always being monitored. For more information, visit: http://floyd.org/www.cdc.gov/vaccinesafety/ 5. What if there is a serious problem? What should I look for?  Look for anything that concerns you, such as signs of a severe allergic reaction, very high fever, or unusual behavior. Signs of a severe allergic reaction can include hives, swelling of the face and throat, difficulty breathing, a fast heartbeat, dizziness, and weakness. These would usually start a few minutes to a few hours after the vaccination. What should I do?  If you think it is a severe allergic reaction or other emergency that can't wait, call 9-1-1 or get the person to the nearest hospital. Otherwise, call your doctor.  Afterward, the reaction should be reported to the Vaccine Adverse Event Reporting System (VAERS). Your doctor might file this report, or you can do it yourself through the VAERS web site at www.vaers.LAgents.nohhs.gov, or by calling 1-(226)389-2277. VAERS does not give medical advice. 6. The National Vaccine Injury Compensation Program The [employer] Vaccine Injury Compensation Program (VICP) is a federal  program that was created to compensate people who may have been injured by certain vaccines. Persons who believe they may have been injured by a vaccine can learn about the program and about filing a claim by calling 1-716-361-9798 or visiting the VICP website at SpiritualWord.atwww.hrsa.gov/vaccinecompensation. There is a time limit to file a claim for compensation. 7. How can I  learn more?  Ask your doctor. He or she can give you the vaccine package insert or suggest other sources of information.  Call your local or state health department.  Contact the Centers for Disease Control and Prevention (CDC): ? Call (562)227-2519 (1-800-CDC-INFO) or ? Visit CDC's website at PicCapture.uy Vaccine Information Statement Tdap Vaccine (08/24/2013) This information is not intended to replace advice given to you by your health care provider. Make sure you discuss any questions you have with your health care provider. Document Released: 12/17/2011 Document Revised: 02/02/2018 Document Reviewed: 02/02/2018 Elsevier Interactive Patient Education  2020 [employer].   The Maternity Assessment Unit (MAU) is located at the [facility] and [facility] at [facility]. The address is: [address] [place], [place], [place] [address]. Please see map below for additional directions.    The Maternity Assessment Unit is designed to help you during your pregnancy, and for up to 6 weeks after delivery, with any pregnancy- or postpartum-related emergencies, if you think you are in labor, or if your water has broken. For example, if you experience nausea and vomiting, vaginal bleeding, severe abdominal or pelvic pain, elevated blood pressure or other problems related to your pregnancy or postpartum time, please come to the Maternity Assessment Unit for assistance.

## 2019-05-07 NOTE — Progress Notes (Signed)
ROB  2hr GTT will be R/S   CC: None

## 2019-05-11 ENCOUNTER — Ambulatory Visit (INDEPENDENT_AMBULATORY_CARE_PROVIDER_SITE_OTHER): Payer: Medicaid Other

## 2019-05-11 ENCOUNTER — Other Ambulatory Visit: Payer: Medicaid Other

## 2019-05-11 ENCOUNTER — Other Ambulatory Visit: Payer: Self-pay

## 2019-05-11 DIAGNOSIS — Z23 Encounter for immunization: Secondary | ICD-10-CM | POA: Diagnosis not present

## 2019-05-11 DIAGNOSIS — Z3480 Encounter for supervision of other normal pregnancy, unspecified trimester: Secondary | ICD-10-CM

## 2019-05-11 NOTE — Progress Notes (Signed)
I have reviewed this chart and agree with the RN/CMA assessment and management.    K. Meryl Besnik Febus, M.D. Attending [facility] (Faculty Practice)   

## 2019-05-11 NOTE — Progress Notes (Signed)
Pt presents to the clinic for a tdap.  Pt tolerated injection well in the RD without any complications. -EH/RMA

## 2019-05-12 ENCOUNTER — Ambulatory Visit (HOSPITAL_COMMUNITY)
Admission: RE | Admit: 2019-05-12 | Discharge: 2019-05-12 | Disposition: A | Discharge status: Home / Self Care | Payer: Medicaid Other | Source: Ambulatory Visit | Attending: Obstetrics and Gynecology | Admitting: Obstetrics and Gynecology

## 2019-05-12 DIAGNOSIS — Z3A27 27 weeks gestation of pregnancy: Secondary | ICD-10-CM

## 2019-05-12 DIAGNOSIS — Z362 Encounter for other antenatal screening follow-up: Secondary | ICD-10-CM

## 2019-05-12 DIAGNOSIS — O0932 Supervision of pregnancy with insufficient antenatal care, second trimester: Secondary | ICD-10-CM | POA: Diagnosis not present

## 2019-05-12 DIAGNOSIS — O43192 Other malformation of placenta, second trimester: Secondary | ICD-10-CM | POA: Diagnosis not present

## 2019-05-12 LAB — CBC
Hematocrit: 30.3 % — ABNORMAL LOW (ref 34.0–46.6)
Hemoglobin: 10.3 g/dL — ABNORMAL LOW (ref 11.1–15.9)
MCH: 33.6 pg — ABNORMAL HIGH (ref 26.6–33.0)
MCHC: 34 g/dL (ref 31.5–35.7)
MCV: 99 fL — ABNORMAL HIGH (ref 79–97)
Platelets: 225 10*3/uL (ref 150–450)
RBC: 3.07 x10E6/uL — ABNORMAL LOW (ref 3.77–5.28)
RDW: 12.2 % (ref 11.7–15.4)
WBC: 13 10*3/uL — ABNORMAL HIGH (ref 3.4–10.8)

## 2019-05-12 LAB — GLUCOSE TOLERANCE, 2 HOURS W/ 1HR
Glucose, 1 hour: 172 mg/dL (ref 65–179)
Glucose, 2 hour: 106 mg/dL (ref 65–152)
Glucose, Fasting: 81 mg/dL (ref 65–91)

## 2019-05-12 LAB — HIV ANTIBODY (ROUTINE TESTING W REFLEX): HIV Screen 4th Generation wRfx: NONREACTIVE

## 2019-05-12 LAB — RPR: RPR Ser Ql: NONREACTIVE

## 2019-05-13 ENCOUNTER — Other Ambulatory Visit (HOSPITAL_COMMUNITY): Payer: Self-pay | Admitting: *Deleted

## 2019-05-13 DIAGNOSIS — O43193 Other malformation of placenta, third trimester: Secondary | ICD-10-CM

## 2019-05-20 NOTE — Progress Notes (Signed)
I connected with Duard Brady on 05/21/19 at 11:15 AM EST by: WebEx and verified that I am speaking with the correct person using two identifiers.  Patient is located at home and provider is located at [facility].     The purpose of this virtual visit is to provide medical care while limiting exposure to the novel coronavirus. I discussed the limitations, risks, security and privacy concerns of performing an evaluation and management service by WebEx and the availability of in person appointments. I also discussed with the patient that there may be a patient responsible charge related to this service. By engaging in this virtual visit, you consent to the provision of healthcare.  Additionally, you authorize for your insurance to be billed for the services provided during this visit.  The patient expressed understanding and agreed to proceed.  The following staff members participated in the virtual visit:  Vernice Jefferson, NP and [place] VISIT NOTE  Subjective:  Amy Decker is a 28 y.o. G3P2002 at [redacted]w[redacted]d  for phone visit for ongoing prenatal care.  She is currently monitored for the following issues for this low-risk pregnancy and has Supervision of other normal pregnancy, antepartum; Late prenatal care affecting pregnancy; and Marginal insertion of umbilical cord affecting management of mother on their problem list.  Patient reports no complaints.  Contractions: Not present. Vag. Bleeding: None.  Movement: Present. Denies leaking of fluid.   The following portions of the patient's history were reviewed and updated as appropriate: allergies, current medications, past family history, past medical history, past social history, past surgical history and problem list.   Objective:   Vitals:   05/21/19 1119  BP: 119/78  Pulse: (!) 114   Self-Obtained  Fetal Status:     Movement: Present     Assessment and Plan:  Pregnancy: G3P2002 at [redacted]w[redacted]d  1. Supervision of other  normal pregnancy, antepartum -no concerns  2. Marginal insertion of umbilical cord affecting management of mother -last scan 05/12/2019, normal -pt has next scan 06/16/2019, pt aware  Preterm labor symptoms and general obstetric precautions including but not limited to vaginal bleeding, contractions, leaking of fluid and fetal movement were reviewed in detail with the patient.  Return in about 2 weeks (around 06/04/2019) for virtual-ROB.  Future Appointments  Date Time Provider [place]  06/16/2019  9:15 AM [place] [facility]  06/16/2019  9:15 AM [facility] [place] 4 [facility]-US    Time spent on virtual visit: 8 minutes  Clarisa Fling, NP

## 2019-05-21 ENCOUNTER — Ambulatory Visit (INDEPENDENT_AMBULATORY_CARE_PROVIDER_SITE_OTHER): Payer: Medicaid Other | Admitting: Women's Health

## 2019-05-21 VITALS — BP 119/78 | HR 114

## 2019-05-21 DIAGNOSIS — O43199 Other malformation of placenta, unspecified trimester: Secondary | ICD-10-CM

## 2019-05-21 DIAGNOSIS — Z3480 Encounter for supervision of other normal pregnancy, unspecified trimester: Secondary | ICD-10-CM

## 2019-05-21 DIAGNOSIS — Z3A28 28 weeks gestation of pregnancy: Secondary | ICD-10-CM

## 2019-05-21 DIAGNOSIS — Z3483 Encounter for supervision of other normal pregnancy, third trimester: Secondary | ICD-10-CM

## 2019-05-21 NOTE — Patient Instructions (Signed)
Healthy Weight Gain During Pregnancy, Adult A certain amount of weight gain during pregnancy is normal and healthy. How much weight you should gain depends on your overall health and a measurement called BMI (body mass index). BMI is an estimate of your body fat based on your height and weight. You can use an online calculator to figure out your BMI, or you can ask your health care provider to calculate it for you at your next visit. Your recommended pregnancy weight gain is based on your pre-pregnancy BMI. General guidelines for a healthy total weight gain during pregnancy are listed below. If your BMI at or before the start of your pregnancy is:  Less than 18.5 (underweight), you should gain 28-40 lb (13-18 kg).  18.5-24.9 (normal weight), you should gain 25-35 lb (11-16 kg).  25-29.9 (overweight), you should gain 15-25 lb (7-11 kg).  30 or higher (obese), you should gain 11-20 lb (5-9 kg). These ranges vary depending on your individual health. If you are carrying more than one baby (multiples), it may be safe to gain more weight than these recommendations. If you gain less weight than recommended, that may be safe as long as your baby is growing and developing normally. How can unhealthy weight gain affect me and my baby? Gaining too much weight during pregnancy can lead to pregnancy complications, such as:  A temporary form of diabetes that develops during pregnancy (gestational diabetes).  High blood pressure during pregnancy and protein in your urine (preeclampsia).  High blood pressure during pregnancy without protein in your urine (gestational hypertension).  Your baby having a high weight at birth, which may: ? Raise your risk of having a more difficult delivery or a surgical delivery (cesarean delivery, or C-section). ? Raise your child's risk of developing obesity during childhood. Not gaining enough weight can be life-threatening for your baby, and it may raise your baby's chances  of:  Being born early (preterm).  Growing more slowly than normal during pregnancy (growth restriction).  Having a low weight at birth. What actions can I take to gain a healthy amount of weight during pregnancy? General instructions  Keep track of your weight gain during pregnancy.  Take over-the-counter and prescription medicines only as told by your health care provider. Take all prenatal supplements as directed.  Keep all health care visits during pregnancy (prenatal visits). These visits are a good time to discuss your weight gain. Your health care provider will weigh you at each visit to make sure you are gaining a healthy amount of weight. Nutrition   Eat a balanced, nutrient-rich diet. Eat plenty of: ? Fruits and vegetables, such as berries and broccoli. ? Whole grains, such as millet, barley, whole-wheat breads and cereals, and oatmeal. ? Low-fat dairy products or non-dairy products such as almond milk or rice milk. ? Protein foods, such as lean meat, chicken, eggs, and legumes (such as peas, beans, soybeans, and lentils).  Avoid foods that are fried or have a lot of fat, salt (sodium), or sugar.  Drink enough fluid to keep your urine pale yellow.  Choose healthy snack and drink options when you are at work or on the go: ? Drink water. Avoid soda, sports drinks, and juices that have added sugar. ? Avoid drinks with caffeine, such as coffee and energy drinks. ? Eat snacks that are high in protein, such as nuts, protein bars, and low-fat yogurt. ? Carry convenient snacks in your purse that do not need refrigeration, such as a pack of   trail mix, an apple, or a granola bar.  If you need help improving your diet, work with a health care provider or a diet and nutrition specialist (dietitian). Activity   Exercise regularly, as told by your health care provider. ? If you were active before becoming pregnant, you may be able to continue your regular fitness activities. ? If  you were not active before pregnancy, you may gradually build up to exercising for 30 or more minutes on most days of the week. This may include walking, swimming, or yoga.  Ask your health care provider what activities are safe for you. Talk with your health care provider about whether you may need to be excused from certain school or work activities. Where to find more information Learn more about managing your weight gain during pregnancy from:  American Pregnancy Association: www.americanpregnancy.org  U.S. Department of Agriculture pregnancy weight gain calculator: https://ball-collins.biz/ Summary  Too much weight gain during pregnancy can lead to complications for you and your baby.  Find out your pre-pregnancy BMI to determine how much weight gain is healthy for you.  Eat nutritious foods and stay active.  Keep all of your prenatal visits as told by your health care provider. This information is not intended to replace advice given to you by your health care provider. Make sure you discuss any questions you have with your health care provider. Document Released: 03/07/2017 Document Revised: 03/07/2017 Document Reviewed: 03/07/2017 Elsevier Patient Education  2020 [employer].   The Maternity Assessment Unit (MAU) is located at the [facility] and [facility] at [facility]. The address is: [address] [place], [place], [place] [address]. Please see map below for additional directions.    The Maternity Assessment Unit is designed to help you during your pregnancy, and for up to 6 weeks after delivery, with any pregnancy- or postpartum-related emergencies, if you think you are in labor, or if your water has broken. For example, if you experience nausea and vomiting, vaginal bleeding, severe abdominal or pelvic pain, elevated blood pressure or other problems related to your pregnancy or postpartum time, please come to the Maternity Assessment Unit for  assistance.     Preterm Labor and Birth Information  The normal length of a pregnancy is 39-41 weeks. Preterm labor is when labor starts before 37 completed weeks of pregnancy. What are the risk factors for preterm labor? Preterm labor is more likely to occur in women who:  Have certain infections during pregnancy such as a bladder infection, sexually transmitted infection, or infection inside the uterus (chorioamnionitis).  Have a shorter-than-normal cervix.  Have gone into preterm labor before.  Have had surgery on their cervix.  Are younger than age 1 or older than age 27.  Are African American.  Are pregnant with twins or multiple babies (multiple gestation).  Take street drugs or smoke while pregnant.  Do not gain enough weight while pregnant.  Became pregnant shortly after having been pregnant. What are the symptoms of preterm labor? Symptoms of preterm labor include:  Cramps similar to those that can happen during a menstrual period. The cramps may happen with diarrhea.  Pain in the abdomen or lower back.  Regular uterine contractions that may feel like tightening of the abdomen.  A feeling of increased pressure in the pelvis.  Increased watery or bloody mucus discharge from the vagina.  Water breaking (ruptured amniotic sac). Why is it important to recognize signs of preterm labor? It is important to recognize signs  of preterm labor because babies who are born prematurely may not be fully developed. This can put them at an increased risk for:  Long-term (chronic) heart and lung problems.  Difficulty immediately after birth with regulating body systems, including blood sugar, body temperature, heart rate, and breathing rate.  Bleeding in the brain.  Cerebral palsy.  Learning difficulties.  Death. These risks are highest for babies who are born before 27 weeks of pregnancy. How is preterm labor treated? Treatment depends on the length of your  pregnancy, your condition, and the health of your baby. It may involve:  Having a stitch (suture) placed in your cervix to prevent your cervix from opening too early (cerclage).  Taking or being given medicines, such as: ? Hormone medicines. These may be given early in pregnancy to help support the pregnancy. ? Medicine to stop contractions. ? Medicines to help mature the babys lungs. These may be prescribed if the risk of delivery is high. ? Medicines to prevent your baby from developing cerebral palsy. If the labor happens before 34 weeks of pregnancy, you may need to stay in the hospital. What should I do if I think I am in preterm labor? If you think that you are going into preterm labor, call your health care provider right away. How can I prevent preterm labor in future pregnancies? To increase your chance of having a full-term pregnancy:  Do not use any tobacco products, such as cigarettes, chewing tobacco, and e-cigarettes. If you need help quitting, ask your health care provider.  Do not use street drugs or medicines that have not been prescribed to you during your pregnancy.  Talk with your health care provider before taking any herbal supplements, even if you have been taking them regularly.  Make sure you gain a healthy amount of weight during your pregnancy.  Watch for infection. If you think that you might have an infection, get it checked right away.  Make sure to tell your health care provider if you have gone into preterm labor before. This information is not intended to replace advice given to you by your health care provider. Make sure you discuss any questions you have with your health care provider. Document Released: 09/07/2003 Document Revised: 10/09/2018 Document Reviewed: 11/08/2015 Elsevier Patient Education  2020 [employer].

## 2019-06-04 ENCOUNTER — Ambulatory Visit (INDEPENDENT_AMBULATORY_CARE_PROVIDER_SITE_OTHER): Payer: Medicaid Other | Admitting: Family Medicine

## 2019-06-04 DIAGNOSIS — Z3A30 30 weeks gestation of pregnancy: Secondary | ICD-10-CM

## 2019-06-04 DIAGNOSIS — O43193 Other malformation of placenta, third trimester: Secondary | ICD-10-CM

## 2019-06-04 DIAGNOSIS — O0933 Supervision of pregnancy with insufficient antenatal care, third trimester: Secondary | ICD-10-CM | POA: Diagnosis not present

## 2019-06-04 DIAGNOSIS — Z3480 Encounter for supervision of other normal pregnancy, unspecified trimester: Secondary | ICD-10-CM

## 2019-06-04 DIAGNOSIS — O43199 Other malformation of placenta, unspecified trimester: Secondary | ICD-10-CM

## 2019-06-04 NOTE — Progress Notes (Signed)
   TELEHEALTH VIRTUAL OBSTETRICS VISIT ENCOUNTER NOTE  I connected with Amy Decker on 06/04/19 at 11:15 AM EST by telephone at home and verified that I am speaking with the correct person using two identifiers.   I discussed the limitations, risks, security and privacy concerns of performing an evaluation and management service by telephone and the availability of in person appointments. I also discussed with the patient that there may be a patient responsible charge related to this service. The patient expressed understanding and agreed to proceed.  Subjective:  Amy Decker is a 28 y.o. G3P2002 at [redacted]w[redacted]d being followed for ongoing prenatal care.  She is currently monitored for the following issues for this low-risk pregnancy and has Supervision of other normal pregnancy, antepartum; Late prenatal care affecting pregnancy; and Marginal insertion of umbilical cord affecting management of mother on their problem list.  Patient reports no complaints. Reports fetal movement. Denies any contractions, bleeding or leaking of fluid.   The following portions of the patient's history were reviewed and updated as appropriate: allergies, current medications, past family history, past medical history, past social history, past surgical history and problem list.   Objective:   General:  Alert, oriented and cooperative.   Mental Status: Normal mood and affect perceived. Normal judgment and thought content.  Rest of physical exam deferred due to type of encounter  Assessment and Plan:  Pregnancy: G3P2002 at [redacted]w[redacted]d  1. Supervision of other normal pregnancy, antepartum - Continue routine prenatal care - Reports normal blood pressures at home - Interested in circumcision information  2. Late prenatal care affecting pregnancy in third trimester  3. Marginal insertion of umbilical cord affecting management of mother - [place] 05/12/19 was normal - Repeat growth scan 06/16/19, patient aware   There are no diagnoses linked to this encounter. Preterm labor symptoms and general obstetric precautions including but not limited to vaginal bleeding, contractions, leaking of fluid and fetal movement were reviewed in detail with the patient.  I discussed the assessment and treatment plan with the patient. The patient was provided an opportunity to ask questions and all were answered. The patient agreed with the plan and demonstrated an understanding of the instructions. The patient was advised to call back or seek an in-person office evaluation/go to [facility] at [facility] for any urgent or concerning symptoms. Please refer to After Visit Summary for other counseling recommendations.   I provided 10 minutes of non-face-to-face time during this encounter.  Return in about 2 weeks (around 06/18/2019) for ROB, virtual [place].  Future Appointments  Date Time Provider [place]  06/16/2019  9:15 AM [place] [facility]  06/16/2019  9:15 AM [place] for [employer] [facility], [place]

## 2019-06-04 NOTE — Progress Notes (Signed)
Pt is on the phone for televisit.

## 2019-06-04 NOTE — Patient Instructions (Signed)
Places to have your son circumcised:                                                                      [facility]     [phone]   $480 while you are in hospital         [facility]              [phone]   $269 by 4 wks                      [facility]                     [phone]   $269 by 7 days [facility]                    [phone]   $269 by 4 wks [facility]             [phone]   $225 by 2 wks    These prices sometimes change but are roughly what you can expect to pay. Please call and confirm pricing.   Circumcision is considered an elective/non-medically necessary procedure. There are many reasons parents decide to have their sons circumsized. During the first year of life circumcised males have a reduced risk of urinary tract infections but after this year the rates between circumcised males and uncircumcised males are the same.  It is safe to have your son circumcised outside of the hospital and the places above perform them regularly.   Deciding about Circumcision in Baby Boys  (Up-to-date The Basics)  What is circumcision?   Circumcision is a surgery that removes the skin that covers the tip of the penis, called the "foreskin" Circumcision is usually done when a boy is between 1 and 10 days old. In the [place], circumcision is common. In some other countries, fewer boys are circumcised. Circumcision is a common tradition in some religions.  Should I have my baby boy circumcised?   There is no easy answer. Circumcision has some benefits. But it also has risks. After talking with your doctor, you will have to decide for yourself what is right for your family.  What are the benefits of circumcision?   Circumcised boys seem to have slightly lower rates of: ?Urinary tract infections ?Swelling of the opening at the tip of the penis Circumcised men seem to have slightly lower rates of: ?Urinary tract infections ?Swelling of the opening at the tip of the penis ?Penis cancer ?HIV  and other infections that you catch during sex ?Cervical cancer in the women they have sex with Even so, in the [place], the risks of these problems are small - even in boys and men who have not been circumcised. Plus, boys and men who are not circumcised can reduce these extra risks by: ?Cleaning their penis well ?Using condoms during sex  What are the risks of circumcision?  Risks include: ?Bleeding or infection from the surgery ?Damage to or amputation of the penis ?A chance that the doctor will cut off too much or not enough of the foreskin ?A chance that sex won't feel as good later in life Only about 1 out of every 200   circumcisions leads to problems. There is also a chance that your health insurance won't pay for circumcision.  How is circumcision done in baby boys?  First, the baby gets medicine for pain relief. This might be a cream on the skin or a shot into the base of the penis. Next, the doctor cleans the baby's penis well. Then he or she uses special tools to cut off the foreskin. Finally, the doctor wraps a bandage (called gauze) around the baby's penis. If you have your baby circumcised, his doctor or nurse will give you instructions on how to care for him after the surgery. It is important that you follow those instructions carefully.  

## 2019-06-16 ENCOUNTER — Other Ambulatory Visit: Payer: Self-pay

## 2019-06-16 ENCOUNTER — Encounter (HOSPITAL_COMMUNITY): Payer: Self-pay

## 2019-06-16 ENCOUNTER — Ambulatory Visit (HOSPITAL_COMMUNITY): Payer: Medicaid Other | Admitting: *Deleted

## 2019-06-16 ENCOUNTER — Ambulatory Visit (HOSPITAL_COMMUNITY)
Admission: RE | Admit: 2019-06-16 | Discharge: 2019-06-16 | Disposition: A | Discharge status: Home / Self Care | Payer: Medicaid Other | Source: Ambulatory Visit | Attending: Obstetrics | Admitting: Obstetrics

## 2019-06-16 ENCOUNTER — Other Ambulatory Visit (HOSPITAL_COMMUNITY): Payer: Self-pay | Admitting: *Deleted

## 2019-06-16 DIAGNOSIS — Z362 Encounter for other antenatal screening follow-up: Secondary | ICD-10-CM

## 2019-06-16 DIAGNOSIS — O43193 Other malformation of placenta, third trimester: Secondary | ICD-10-CM | POA: Insufficient documentation

## 2019-06-16 DIAGNOSIS — Z3A32 32 weeks gestation of pregnancy: Secondary | ICD-10-CM

## 2019-06-16 DIAGNOSIS — O0933 Supervision of pregnancy with insufficient antenatal care, third trimester: Secondary | ICD-10-CM | POA: Diagnosis not present

## 2019-06-16 DIAGNOSIS — O43199 Other malformation of placenta, unspecified trimester: Secondary | ICD-10-CM

## 2019-06-18 ENCOUNTER — Ambulatory Visit (INDEPENDENT_AMBULATORY_CARE_PROVIDER_SITE_OTHER): Payer: Medicaid Other | Admitting: Medical

## 2019-06-18 ENCOUNTER — Encounter: Payer: Self-pay | Admitting: Medical

## 2019-06-18 VITALS — BP 131/75 | HR 103

## 2019-06-18 DIAGNOSIS — Z3A32 32 weeks gestation of pregnancy: Secondary | ICD-10-CM

## 2019-06-18 DIAGNOSIS — O43199 Other malformation of placenta, unspecified trimester: Secondary | ICD-10-CM

## 2019-06-18 DIAGNOSIS — Z3480 Encounter for supervision of other normal pregnancy, unspecified trimester: Secondary | ICD-10-CM

## 2019-06-18 DIAGNOSIS — O43193 Other malformation of placenta, third trimester: Secondary | ICD-10-CM

## 2019-06-18 NOTE — Progress Notes (Signed)
I connected with Amy Decker on 06/18/19 at 11:00 AM EST by: WebEx and verified that I am speaking with the correct person using two identifiers.  Patient is located at home and provider is located at [facility].     The purpose of this virtual visit is to provide medical care while limiting exposure to the novel coronavirus. I discussed the limitations, risks, security and privacy concerns of performing an evaluation and management service by WebEx and the availability of in person appointments. I also discussed with the patient that there may be a patient responsible charge related to this service. By engaging in this virtual visit, you consent to the provision of healthcare.  Additionally, you authorize for your insurance to be billed for the services provided during this visit.  The patient expressed understanding and agreed to proceed.  The following staff members participated in the virtual visit:  Ariel Burch    PRENATAL VISIT NOTE  Subjective:  Amy Decker is a 28 y.o. G3P2002 at [redacted]w[redacted]d  for phone visit for ongoing prenatal care.  She is currently monitored for the following issues for this high-risk pregnancy and has Supervision of other normal pregnancy, antepartum; Late prenatal care affecting pregnancy; and Marginal insertion of umbilical cord affecting management of mother on their problem list.  Patient reports no complaints.  Contractions: Irritability. Vag. Bleeding: None.  Movement: Present. Denies leaking of fluid.   The following portions of the patient's history were reviewed and updated as appropriate: allergies, current medications, past family history, past medical history, past social history, past surgical history and problem list.   Objective:   Vitals:   06/18/19 1050  BP: 131/75  Pulse: (!) 103   Self-Obtained  Fetal Status:     Movement: Present     Assessment and Plan:  Pregnancy: G3P2002 at [redacted]w[redacted]d 1. Supervision of other normal pregnancy,  antepartum  2. Marginal insertion of umbilical cord affecting management of mother - Repeat growth [place] scheduled with MFM on 07/14/19  Preterm labor symptoms and general obstetric precautions including but not limited to vaginal bleeding, contractions, leaking of fluid and fetal movement were reviewed in detail with the patient.  Return in about 2 weeks (around 07/02/2019) for LOB, Virtual.  Future Appointments  Date Time Provider [place]  07/14/2019 11:15 AM [place] [facility]  07/14/2019 11:15 AM [facility] [place] 4 [facility]     Time spent on virtual visit: 10 minutes  Kerry Hough, PA-C

## 2019-06-18 NOTE — Progress Notes (Signed)
Pt presents for webex visit. Pt identified with 2 patient identifiers. Pt bp today is 131/75. Pt has no concerns.

## 2019-06-18 NOTE — Patient Instructions (Addendum)
Fetal Movement Counts °Patient Name: ________________________________________________ Patient Due Date: ____________________ °What is a fetal movement count? ° °A fetal movement count is the number of times that you feel your baby move during a certain amount of time. This may also be called a fetal kick count. A fetal movement count is recommended for every pregnant woman. You may be asked to start counting fetal movements as early as week 28 of your pregnancy. °Pay attention to when your baby is most active. You may notice your baby's sleep and wake cycles. You may also notice things that make your baby move more. You should do a fetal movement count: °· When your baby is normally most active. °· At the same time each day. °A good time to count movements is while you are resting, after having something to eat and drink. °How do I count fetal movements? °1. Find a quiet, comfortable area. Sit, or lie down on your side. °2. Write down the date, the start time and stop time, and the number of movements that you felt between those two times. Take this information with you to your health care visits. °3. For 2 hours, count kicks, flutters, swishes, rolls, and jabs. You should feel at least 10 movements during 2 hours. °4. You may stop counting after you have felt 10 movements. °5. If you do not feel 10 movements in 2 hours, have something to eat and drink. Then, keep resting and counting for 1 hour. If you feel at least 4 movements during that hour, you may stop counting. °Contact a health care provider if: °· You feel fewer than 4 movements in 2 hours. °· Your baby is not moving like he or she usually does. °Date: ____________ Start time: ____________ Stop time: ____________ Movements: ____________ °Date: ____________ Start time: ____________ Stop time: ____________ Movements: ____________ °Date: ____________ Start time: ____________ Stop time: ____________ Movements: ____________ °Date: ____________ Start time:  ____________ Stop time: ____________ Movements: ____________ °Date: ____________ Start time: ____________ Stop time: ____________ Movements: ____________ °Date: ____________ Start time: ____________ Stop time: ____________ Movements: ____________ °Date: ____________ Start time: ____________ Stop time: ____________ Movements: ____________ °Date: ____________ Start time: ____________ Stop time: ____________ Movements: ____________ °Date: ____________ Start time: ____________ Stop time: ____________ Movements: ____________ °This information is not intended to replace advice given to you by your health care provider. Make sure you discuss any questions you have with your health care provider. °Document Released: 07/17/2006 Document Revised: 07/07/2018 Document Reviewed: 07/27/2015 °Elsevier Patient Education © 2020 Elsevier Inc. °Braxton Hicks Contractions °Contractions of the uterus can occur throughout pregnancy, but they are not always a sign that you are in labor. You may have practice contractions called Braxton Hicks contractions. These false labor contractions are sometimes confused with true labor. °What are Braxton Hicks contractions? °Braxton Hicks contractions are tightening movements that occur in the muscles of the uterus before labor. Unlike true labor contractions, these contractions do not result in opening (dilation) and thinning of the cervix. Toward the end of pregnancy (32-34 weeks), Braxton Hicks contractions can happen more often and may become stronger. These contractions are sometimes difficult to tell apart from true labor because they can be very uncomfortable. You should not feel embarrassed if you go to the hospital with false labor. °Sometimes, the only way to tell if you are in true labor is for your health care provider to look for changes in the cervix. The health care provider will do a physical exam and may monitor your contractions. If you   are not in true labor, the exam should show  that your cervix is not dilating and your water has not broken. °If there are no other health problems associated with your pregnancy, it is completely safe for you to be sent home with false labor. You may continue to have Braxton Hicks contractions until you go into true labor. °How to tell the difference between true labor and false labor °True labor °· Contractions last 30-70 seconds. °· Contractions become very regular. °· Discomfort is usually felt in the top of the uterus, and it spreads to the lower abdomen and low back. °· Contractions do not go away with walking. °· Contractions usually become more intense and increase in frequency. °· The cervix dilates and gets thinner. °False labor °· Contractions are usually shorter and not as strong as true labor contractions. °· Contractions are usually irregular. °· Contractions are often felt in the front of the lower abdomen and in the groin. °· Contractions may go away when you walk around or change positions while lying down. °· Contractions get weaker and are shorter-lasting as time goes on. °· The cervix usually does not dilate or become thin. °Follow these instructions at home: ° °· Take over-the-counter and prescription medicines only as told by your health care provider. °· Keep up with your usual exercises and follow other instructions from your health care provider. °· Eat and drink lightly if you think you are going into labor. °· If Braxton Hicks contractions are making you uncomfortable: °? Change your position from lying down or resting to walking, or change from walking to resting. °? Sit and rest in a tub of warm water. °? Drink enough fluid to keep your urine pale yellow. Dehydration may cause these contractions. °? Do slow and deep breathing several times an hour. °· Keep all follow-up prenatal visits as told by your health care provider. This is important. °Contact a health care provider if: °· You have a fever. °· You have continuous pain in  your abdomen. °Get help right away if: °· Your contractions become stronger, more regular, and closer together. °· You have fluid leaking or gushing from your vagina. °· You pass blood-tinged mucus (bloody show). °· You have bleeding from your vagina. °· You have low back pain that you never had before. °· You feel your baby’s head pushing down and causing pelvic pressure. °· Your baby is not moving inside you as much as it used to. °Summary °· Contractions that occur before labor are called Braxton Hicks contractions, false labor, or practice contractions. °· Braxton Hicks contractions are usually shorter, weaker, farther apart, and less regular than true labor contractions. True labor contractions usually become progressively stronger and regular, and they become more frequent. °· Manage discomfort from Braxton Hicks contractions by changing position, resting in a warm bath, drinking plenty of water, or practicing deep breathing. °This information is not intended to replace advice given to you by your health care provider. Make sure you discuss any questions you have with your health care provider. °Document Released: 10/31/2016 Document Revised: 05/30/2017 Document Reviewed: 10/31/2016 °Elsevier Patient Education © 2020 Elsevier Inc. ° °AREA P[facility]ATR[facility]/FAMILY PRACTICE PHYSICIANS ° °[place] ([address]) °• [facility] °o Chambliss, MD; Eniola, MD; Hale, MD; Hensel, MD; McDiarmid, MD; McIntyer, MD; Neal, MD; Walden, MD °o [address] [place], [place] [address] °o [phone] °o Mon-Fri 8:30-12:30, 1:30-5:00 °o Providers come to see babies at [facility] °o Accepting Medicaid °• [facility] °o Limited providers who accept newborns: Koirala, MD; Morrow, MD; Wolters, MD °o [address], [place], [place] [address] °o [phone] °o Mon-Fri 8:00-5:30 °o Babies seen by providers at [facility] °o Does NOT accept  Medicaid °o Please call early in hospitalization for appointment (limited availability)  °• [facility] °o Mulberry, MD °o [address] [place], [place] [address] °o [phone] °o Mon, Tue, Thur, Fri 8:30-5:00, Wed 10:00-7:00 (closed 1-2pm) °o Babies seen by [facility] providers °o Accepting Medicaid °• [facility] °o Rubin, MD °o [address], [place], [place] [address] °o [phone] °o Mon-Fri 8:30-5:00, Sat 8:30-12:00 °o Provider comes to see babies at [facility] °o Accepting Medicaid °o Must have been referred from current patients or contacted office prior to delivery °• [facility] ([facility]) °o Brown, MD; Chandler, MD; Ettefagh, MD; Grant, MD; Lester, MD; McCormick, MD; McQueen, MD; Prose, MD; Simha, MD; Stanley, MD; Stryffeler, NP; Tebben, NP °o [address], [place], [place] [address] °o [phone] °o Mon, Tue, Thur, Fri 8:30-5:30, Wed 9:30-5:30, Sat 8:30-12:30 °o Babies seen by [facility] providers °o Accepting Medicaid °o Only accepting infants of first-time parents or siblings of current patients °o Hospital discharge coordinator will make follow-up appointment °• Jack Amos °o [address], [place], [place]  [address] °o [phone]   Fax - [phone] °• [facility] °o [address], [place], [place]  [address] °o Phone - [phone]   Fax - [phone] °• Shilpa Gosrani °o [address], [place], [place]  [address] °o [phone] ° °[place] ([address]) °• [facility] °o Bates, MD; Brassfield, MD; Cooper, Cox, MD; MD; Davis, MD; Dovico, MD; Ettefaugh, MD; Little, MD; Lowe, MD; Keiffer, MD; Melvin, MD; Sumner, MD; Williams, MD °o [address], [place], [place] [address] °o [phone] °o Mon-Fri 8:30-5:00 (extended evenings Mon-Thur as needed), Sat-Sun 10:00-1:00 °o Providers come to see babies at [facility] °o Accepting Medicaid for families of first-time babies and families with all children in the household age 3 and under. Must register with office prior to making appointment (M-F only). °• [facility] °o Henson, NP; Knapp, MD; Lalonde, MD; Tysinger, PA °o [address] [place], [place] [address] °o [phone] °o Mon-Fri 8:00-5:00 °o Babies seen by providers at [facility] °o Does NOT accept Medicaid/Commercial Insurance Only °• [facility] ([facility]  °o Artis, MD; Barnes, MD; Bratton, MD; Coccaro, MD; Lockett Gardner, MD; Kramer, MD; Marshall, MD; Netherton, MD; Poleto, MD; Skinner, MD °o [address] [place], [place] [address] °o [phone] °o Mon-Fri 8:30-5:30, Sat (Oct.-Mar.) 9:00-1:00 °o Babies seen by providers at [facility] °o Accepting Medicaid ° °[place] ([address]) °• [facility] °o Reid, MD; Warner, MD °o [address], [place], [place] [address] °o [phone] °o Mon-Fri 8:30-5:00, Sat 8:30-12:00 °o Providers come to see babies at [facility] °o Does NOT accept Medicaid °• [facility] °o Becker, PA; Hagler, MD; Scifres, PA; Sun, MD; Swayne, MD °o [address], [place],  [address] °o [phone] °o Mon-Fri 8:00-5:00 °o Babies seen by providers at [facility] °o Does NOT accept Medicaid °o Only accepting babies of parents who are patients °o Please call early in hospitalization for appointment (limited availability) °•    Pediatric[facility] °o Clark, MD; Frye, MD; Kelleher, MD; Mack, NP; Miller, MD; O'Keller, MD; Patterson, NP; Pudlo, MD; Puzio, MD; Thomas, MD; Tucker, MD; Twiselton, MD °o [address], [place], [place] [address] °o [phone] °o Mon-Fri 8:00-5:00, Sat 9:00-12:00 °o Providers come to see babies at [facility] °o Does NOT accept Medicaid ° °[place] ([address]) °• [facility] °o Limited providers accepting new patients: Brake, NP; Wharton, PA °o [address], [place], [place] [address] °o [phone] °o Mon-Fri 8:00-5:00 °o Babies seen by providers at [facility] °o Does NOT accept Medicaid °o Only accepting babies of parents who are patients °o Please call early in hospitalization for appointment (limited availability) °• [facility] °o Gay, MD; Quinlan, MD °o [address] [place], [place] [address] °o [phone] (press 1 to schedule appointment) °o Mon-Fri 8:00-5:00 °o Providers come to see babies at [facility] °o Does NOT accept Medicaid °• [facility] °o Mazer, MD °o [address] [place], [place] [address] °o [phone] °o Mon-Fri 8:30-5:00 (lunch 12:30-1:00), extended hours by appointment only Wed 5:00-6:30 °o Babies seen by [facility] providers °o Accepting Medicaid °• [facility] °o Banks, MD; Jordan, MD; Koberlein, MD °o [address], [place], [place] [address] °o [phone] °o Mon-Fri 8:00-5:00 °o Babies seen by [facility] providers °o Does NOT accept Medicaid °• [facility] °o Parker, MD; [place], MD; Wallace, DO °o [address] [place], [place] [address] °o [phone] °o Mon-Fri 8:00-5:00 °o Babies seen by [facility] providers °o Does NOT accept Medicaid °• [facility] °o Brandon, PA; Brecken, PA; Christy, NP; Dees, MD; DeClaire, MD; DeWeese, MD; Hansen, NP; Mills, NP; Parrish, NP; Smoot, NP; Summer, MD; Vapne, MD °o [address] [place], [place] [address] °o [phone] °o Mon-Fri 8:30-5:00, Sat 10:00-1:00 °o Providers come to see babies at [facility] °o Does NOT accept Medicaid °o Free prenatal information session Tuesdays at 4:45pm °• [facility] °o Bouska, MD; Gordon, PA; Jeffery, PA; Weber, PA °o [address] [place] [address] °o [phone] °o Mon-Fri  7:30-5:30 °o Babies seen by [facility] providers °• [facility] °o [address], [place], [place]  [address] °o [phone]   Fax - [phone] ° °[place] ([address]) °• [facility] °o Reese, MD °o [address] [place], [place] [address] °o [phone] °o Mon-Thur 8:00-6:00 °o Providers come to see babies at [facility] °o Accepting Medicaid °• [facility] °o Anderson, NP; [place], MD; Beal, PA; Spencer, PA °o [address] [place], [place] [address] °o [phone] °o Mon-Thur 7:30-7:30, Fri 7:30-4:30 °o Babies seen by [facility] providers °o Accepting Medicaid °• [facility] °o Agbuya, MD; Klett, NP; Romgoolam, MD °o [address], [place], [place] [address] °o [phone] °o Mon-Fri 8:30-5:00, Sat 8:30-12:00 °o Providers come to see babies at [facility] °o Accepting Medicaid °o Must have “Meet & Greet” appointment at office prior to delivery °• [facility] ([facility] °o McCord, MD; Wallace, MD; Wood, MD °o [address], [place],  [address] °o [phone] °o Mon-Wed 8:00-6:00, Thur-Fri 8:00-5:00, Sat 9:00-12:00 °o Providers come to see babies at [facility] °o Does NOT accept Medicaid °o Only accepting siblings of current patients °• [facility]  °o [address], [place],   [place]  [address] °o [phone]   Fax - [phone] °• [facility] °o [address], [place], [place]  [address] °o [phone]   Fax - [phone] ° °[place] ([address]) °• [facility] °o Cirigliano, DO; Matthews, DO °o [address] [place], [place] [address] °o [phone] °o Mon-Fri 7:00-5:00 °o Babies seen by [facility] providers °o Does NOT accept Medicaid °• [facility] °o Briscoe, MD; Howley, PA; Moreira, PA °o [address], [place], [place] [address] °o [phone] °o Mon-Fri 8:00-5:00 °o Babies seen by [facility] providers °o Accepting Medicaid °• [facility] °o Boyd, MD; Church, PA; Jones, NP; Osborn, PA °o [address], [place], [place] [address] °o [phone] °o Mon-Fri 8:00-5:00 °o Babies seen by providers at [facility] °o Accepting Medicaid ° °[place] ([address]) °• [facility] [place] °o Wendling, DO °o [address] [place], [place] [address] °o [phone] °o Mon-Fri 8:00-5:00 °o Babies seen by [facility] providers °o Does NOT accept Medicaid °o Limited availability, please call early in hospitalization to schedule follow-up °• [facility] °o Calderon, PA; Cummings, MD; Dillard, MD; Martin, PA; Olson, MD; VanDeven, PA °o [address], [place], [place] [address] °o [phone] °o Mon-Fri 8:30-5:00, Sat 9:00-12:00 °o Babies seen by providers at [facility] °o Accepting Medicaid °o Please register online then schedule online or call office °o [email or web address] °• [facility] ([facility] °o [place], NP; Kumar, MD; Martin Rogers, PA °o [address], [place], [place] [address] °o [phone] °o Mon-Fri 8:00-5:00 °o Babies seen by providers at [facility] °o Accepting Medicaid °• [facility] ([facility]) °o Pawt[place], MD; Kristi Fleenor, NP; West, MD °o [address], [place], [place] [address] °o [phone] °o Mon-Fri 8:00-5:30, Sat&Sun by appointment (phones open at 8:30) °o Babies seen by [facility] providers °o Accepting Medicaid °o Must be a first-time baby or sibling of current patient °• [facility] [place]  °o [address], [place], [place]   [address] °o [phone]   Fax - [phone] ° °[place] ([address]) °• [facility] °o Brown, PA; Cowen, PA; Rice, MD; Helton, PA; Spry, MD °o [address] [place], [place] [address] °o [phone] °o Mon-Thur 8:00-7:00, Fri 8:00-5:00, Sat 8:00-12:00, Sun 9:00-12:00 °o Babies seen by [facility] providers °o Accepting Medicaid °• [facility] [place] °o Coe-Goins, MD; Marshall, MD; Pierre-Louis, MD °o [address], [place],  [address] °o [phone] °o Mon-Thur 8:00-5:00 °o Babies seen by providers at [facility] °o Accepting Medicaid °• [facility] °o Bratton, MD; Coe-Goins, MD; Hayes, MD; Lewis, MD; List, MD; Lott, MD; Marshall, MD; Moran, MD; O'Neal, MD; Pierre-Louis, MD; Pitonzo, MD; Scholer, MD; Spangle, MD °o [address] [place],  [address] °o [phone] °o Mon-Fri 8:00-5:30, Sat (Oct.-Mar.) 9:00-1:00 °o Babies seen by providers at [facility] °o Accepting Medicaid °o Must fill out new patient packet, available online at [email or web address] °• [facility] ([facility]) °o Friddle, NP; Harris, NP; Kelly, NP; Logan, MD; Melvin, PA; Poth, MD; Ramadoss, MD; Stanton,   NP °o [address], [place], [place] [address] °o [phone] °o Mon-Thur 8:00-5:30, Fri 8:00-5:00 °o Babies seen by providers at [facility] °o Accepting Medicaid ° °[place] ([address]) °• [facility] °o Dixon, PA; Rockwood, MD; Pickard, MD; Tapia, PA °o [address], [place], [place] [address] °o [phone] °o Mon-Fri 8:00-5:00 °o Babies seen by providers at [facility] °o Accepting Medicaid  ° °[place] ([address]) °• [facility] [place] °o Masneri, DO; Meyers, MD; Nelson, PA °o [address], [place], [place] [address] °o [phone] °o Mon-Fri 8:00-5:00 °o Babies  seen by providers at [facility] °o Does NOT accept Medicaid °o Limited appointment availability, please call early in hospitalization ° °• [facility] [place] °o Kunedd, DO; McGowen, MD °o [address], [place], [place] [address] °o [phone] °o Mon-Fri 8:00-5:00 °o Babies seen by [facility] providers °o Does NOT accept Medicaid °• [facility] [place] °o Cameron, MD; MacDonald, MD; Michaels, PA; Nayak, MD °o [address] [place] [address], [place], [place] [address] °o [phone] °o Mon-Fri 8:00-5:00 °o After hours clinic ([address] [place], [place] [address]) [phone] Mon-Fri 5:00-8:00, Sat 12:00-6:00, Sun 10:00-4:00 °o Babies seen by [facility] providers °o Accepting Medicaid °• [facility] [place] °o [address], [place], [place]  [address] °o [phone]   Fax - [phone] ° °[place] ([address]) °• [facility] [place] °o Andy, MD °o [address], [place], [place] [address] °o [phone] °o Mon-Fri 8:00-5:00 °o Babies seen by [facility] providers °o Does NOT accept Medicaid °• [facility] [place] ([facility] [place]) °o Eksir, MD °o [address], [place], [place] [address] °o [phone] °o Mon-Thur 8:00-7:00, Fri 8:00-5:00, Sat 8:00-12:00 °o Babies seen by providers at [facility] °o Accepting Medicaid - but does not have vaccinations in office (must be received elsewhere) °o Limited availability, please call early in hospitalization ° °[place] ([address]) °• [facility]  °o Charlene Flemming, MD °o [address], [place]  [address] °o [phone]  Fax [phone] ° ° °

## 2019-07-01 ENCOUNTER — Telehealth: Payer: Medicaid Other | Admitting: Family Medicine

## 2019-07-02 NOTE — L&D Delivery Note (Addendum)
OB/GYN Faculty Practice Delivery Note  Amy [place] is a 29 y.o. G3P2002 at [redacted]w[redacted]d. She was admitted for PROM.   ROM: 15h 58m with clear fluid GBS Status: negative Maximum Maternal Temperature: 98.4*F  Labor Progress: Patient presented after SROM for clear at home at 0830 with CVE 2.5/70/-3. She received pitocin and progressed to complete.  Delivery Date/Time: 08/05/2019 at 2339 Delivery: Called to room and patient was complete and pushing. Head delivered left occiput anterior. No nuchal cord present. Shoulder and body delivered in usual fashion. Infant with spontaneous cry, placed on mother's abdomen, dried and stimulated. Cord clamped x 2 after 1-minute delay, and cut by this provider. Cord blood drawn. Placenta delivered spontaneously with gentle cord traction. Fundus firm with massage and Pitocin. Labia, perineum, vagina, and cervix inspected with only a hemostatic left periurethral abrasion.  Placenta: 3VC, intact, to L&D Complications: none Lacerations: none EBL: 66mL Analgesia: epidural  Postpartum Planning [ ]  message to sent to schedule follow-up  [x]  vaccines UTD  Infant: female  APGARs 9,9  weight pending per medical chart  , MD [facility] Family Medicine, PGY-1 08/05/2019, 11:51 PM  The above was performed under my direct supervision and guidance.

## 2019-07-07 ENCOUNTER — Encounter: Payer: Medicaid Other | Admitting: Certified Nurse Midwife

## 2019-07-09 ENCOUNTER — Telehealth (INDEPENDENT_AMBULATORY_CARE_PROVIDER_SITE_OTHER): Payer: Medicaid Other | Admitting: Women's Health

## 2019-07-09 VITALS — BP 131/70 | HR 91

## 2019-07-09 DIAGNOSIS — Z3A35 35 weeks gestation of pregnancy: Secondary | ICD-10-CM | POA: Diagnosis not present

## 2019-07-09 DIAGNOSIS — O0933 Supervision of pregnancy with insufficient antenatal care, third trimester: Secondary | ICD-10-CM | POA: Diagnosis not present

## 2019-07-09 DIAGNOSIS — O43193 Other malformation of placenta, third trimester: Secondary | ICD-10-CM | POA: Diagnosis not present

## 2019-07-09 DIAGNOSIS — Z3480 Encounter for supervision of other normal pregnancy, unspecified trimester: Secondary | ICD-10-CM

## 2019-07-09 DIAGNOSIS — O43199 Other malformation of placenta, unspecified trimester: Secondary | ICD-10-CM

## 2019-07-09 NOTE — Patient Instructions (Signed)
Maternity Assessment Unit (MAU)  The Maternity Assessment Unit (MAU) is located at the [facility] at [facility]. The address is: [address] [place], [place], [place] [address]. Please see map below for additional directions.    The Maternity Assessment Unit is designed to help you during your pregnancy, and for up to 6 weeks after delivery, with any pregnancy- or postpartum-related emergencies, if you think you are in labor, or if your water has broken. For example, if you experience nausea and vomiting, vaginal bleeding, severe abdominal or pelvic pain, elevated blood pressure or other problems related to your pregnancy or postpartum time, please come to the Maternity Assessment Unit for assistance.     Signs and Symptoms of Labor Labor is your body's natural process of moving your baby, placenta, and umbilical cord out of your uterus. The process of labor usually starts when your baby is full-term, between 103 and 40 weeks of pregnancy. How will I know when I am close to going into labor? As your body prepares for labor and the birth of your baby, you may notice t he following symptoms in the weeks and days before true labor starts:  Having a strong desire to get your home ready to receive your new baby. This is called nesting. Nesting may be a sign that labor is approaching, and it may occur several weeks before birth. Nesting may involve cleaning and organizing your home.  Passing a small amount of thick, bloody mucus out of your vagina (normal bloody show or losing your mucus plug). This may happen more than a week before labor begins, or it might occur right before labor begins as the opening of the cervix starts to widen (dilate). For some women, the entire mucus plug passes at once. For others, smaller portions of the mucus plug may gradually pass over several days.  Your baby moving (dropping) lower in your pelvis to get into position for birth  (lightening). When this happens, you may feel more pressure on your bladder and pelvic bone and less pressure on your ribs. This may make it easier to breathe. It may also cause you to need to urinate more often and have problems with bowel movements.  Having "practice contractions" (Braxton Hicks contractions) that occur at irregular (unevenly spaced) intervals that are more than 10 minutes apart. This is also called false labor. False labor contractions are common after exercise or sexual activity, and they will stop if you change position, rest, or drink fluids. These contractions are usually mild and do not get stronger over time. They may feel like: ? A backache or back pain. ? Mild cramps, similar to menstrual cramps. ? Tightening or pressure in your abdomen. Other early symptoms that labor may be starting soon include:  Nausea or loss of appetite.  Diarrhea.  Having a sudden burst of energy, or feeling very tired.  Mood changes.  Having trouble sleeping. How will I know when labor has begun? Signs that true labor has begun may include:  Having contractions that come at regular (evenly spaced) intervals and increase in intensity. This may feel like more intense tightening or pressure in your abdomen that moves to your back. ? Contractions may also feel like rhythmic pain in your upper thighs or back that comes and goes at regular intervals. ? For first-time mothers, this change in intensity of contractions often occurs at a more gradual pace. ? Women who have given birth before may notice a more  rapid progression of contraction changes.  Having a feeling of pressure in the vaginal area.  Your water breaking (rupture of membranes). This is when the sac of fluid that surrounds your baby breaks. When this happens, you will notice fluid leaking from your vagina. This may be clear or blood-tinged. Labor usually starts within 24 hours of your water breaking, but it may take longer to  begin. ? Some women notice this as a gush of fluid. ? Others notice that their underwear repeatedly becomes damp. Follow these instructions at home:   When labor starts, or if your water breaks, call your health care provider or nurse care line. Based on your situation, they will determine when you should go in for an exam.  When you are in early labor, you may be able to rest and manage symptoms at home. Some strategies to try at home include: ? Breathing and relaxation techniques. ? Taking a warm bath or shower. ? Listening to music. ? Using a heating pad on the lower back for pain. If you are directed to use heat:  Place a towel between your skin and the heat source.  Leave the heat on for 20-30 minutes.  Remove the heat if your skin turns bright red. This is especially important if you are unable to feel pain, heat, or cold. You may have a greater risk of getting burned. Get help right away if:  You have painful, regular contractions that are 5 minutes apart or less.  Labor starts before you are [redacted] weeks along in your pregnancy.  You have a fever.  You have a headache that does not go away.  You have bright red blood coming from your vagina.  You do not feel your baby moving.  You have a sudden onset of: ? Severe headache with vision problems. ? Nausea, vomiting, or diarrhea. ? Chest pain or shortness of breath. These symptoms may be an emergency. If your health care provider recommends that you go to the hospital or birth center where you plan to deliver, do not drive yourself. Have someone else drive you, or call emergency services (911 in the [place] Summary  Labor is your body's natural process of moving your baby, placenta, and umbilical cord out of your uterus.  The process of labor usually starts when your baby is full-term, between 42 and 40 weeks of pregnancy.  When labor starts, or if your water breaks, call your health care provider or nurse care line. Based  on your situation, they will determine when you should go in for an exam. This information is not intended to replace advice given to you by your health care provider. Make sure you discuss any questions you have with your health care provider. Document Revised: 03/17/2017 Document Reviewed: 11/22/2016 Elsevier Patient Education  2020 [employer]. Third Trimester of Pregnancy The third trimester is from week 28 through week 40 (months 7 through 9). The third trimester is a time when the unborn baby (fetus) is growing rapidly. At the end of the ninth month, the fetus is about 20 inches in length and weighs 6-10 pounds. Body changes during your third trimester Your body will continue to go through many changes during pregnancy. The changes vary from woman to woman. During the third trimester:  Your weight will continue to increase. You can expect to gain 25-35 pounds (11-16 kg) by the end of the pregnancy.  You may begin to get stretch marks on your hips, abdomen, and breasts.  You may urinate more often because the fetus is moving lower into your pelvis and pressing on your bladder.  You may develop or continue to have heartburn. This is caused by increased hormones that slow down muscles in the digestive tract.  You may develop or continue to have constipation because increased hormones slow digestion and cause the muscles that push waste through your intestines to relax.  You may develop hemorrhoids. These are swollen veins (varicose veins) in the rectum that can itch or be painful.  You may develop swollen, bulging veins (varicose veins) in your legs.  You may have increased body aches in the pelvis, back, or thighs. This is due to weight gain and increased hormones that are relaxing your joints.  You may have changes in your hair. These can include thickening of your hair, rapid growth, and changes in texture. Some women also have hair loss during or after pregnancy, or hair that feels  dry or thin. Your hair will most likely return to normal after your baby is born.  Your breasts will continue to grow and they will continue to become tender. A yellow fluid (colostrum) may leak from your breasts. This is the first milk you are producing for your baby.  Your belly button may stick out.  You may notice more swelling in your hands, face, or ankles.  You may have increased tingling or numbness in your hands, arms, and legs. The skin on your belly may also feel numb.  You may feel short of breath because of your expanding uterus.  You may have more problems sleeping. This can be caused by the size of your belly, increased need to urinate, and an increase in your body's metabolism.  You may notice the fetus "dropping," or moving lower in your abdomen (lightening).  You may have increased vaginal discharge.  You may notice your joints feel loose and you may have pain around your pelvic bone. What to expect at prenatal visits You will have prenatal exams every 2 weeks until week 36. Then you will have weekly prenatal exams. During a routine prenatal visit:  You will be weighed to make sure you and the baby are growing normally.  Your blood pressure will be taken.  Your abdomen will be measured to track your baby's growth.  The fetal heartbeat will be listened to.  Any test results from the previous visit will be discussed.  You may have a cervical check near your due date to see if your cervix has softened or thinned (effaced).  You will be tested for Group B streptococcus. This happens between 35 and 37 weeks. Your health care provider may ask you:  What your birth plan is.  How you are feeling.  If you are feeling the baby move.  If you have had any abnormal symptoms, such as leaking fluid, bleeding, severe headaches, or abdominal cramping.  If you are using any tobacco products, including cigarettes, chewing tobacco, and electronic cigarettes.  If you have  any questions. Other tests or screenings that may be performed during your third trimester include:  Blood tests that check for low iron levels (anemia).  Fetal testing to check the health, activity level, and growth of the fetus. Testing is done if you have certain medical conditions or if there are problems during the pregnancy.  Nonstress test (NST). This test checks the health of your baby to make sure there are no signs of problems, such as the baby not getting enough oxygen.  During this test, a belt is placed around your belly. The baby is made to move, and its heart rate is monitored during movement. What is false labor? False labor is a condition in which you feel small, irregular tightenings of the muscles in the womb (contractions) that usually go away with rest, changing position, or drinking water. These are called Braxton Hicks contractions. Contractions may last for hours, days, or even weeks before true labor sets in. If contractions come at regular intervals, become more frequent, increase in intensity, or become painful, you should see your health care provider. What are the signs of labor?  Abdominal cramps.  Regular contractions that start at 10 minutes apart and become stronger and more frequent with time.  Contractions that start on the top of the uterus and spread down to the lower abdomen and back.  Increased pelvic pressure and dull back pain.  A watery or bloody mucus discharge that comes from the vagina.  Leaking of amniotic fluid. This is also known as your "water breaking." It could be a slow trickle or a gush. Let your health care provider know if it has a color or strange odor. If you have any of these signs, call your health care provider right away, even if it is before your due date. Follow these instructions at home: Medicines  Follow your health care provider's instructions regarding medicine use. Specific medicines may be either safe or unsafe to take  during pregnancy.  Take a prenatal vitamin that contains at least 600 micrograms (mcg) of folic acid.  If you develop constipation, try taking a stool softener if your health care provider approves. Eating and drinking   Eat a balanced diet that includes fresh fruits and vegetables, whole grains, good sources of protein such as meat, eggs, or tofu, and low-fat dairy. Your health care provider will help you determine the amount of weight gain that is right for you.  Avoid raw meat and uncooked cheese. These carry germs that can cause birth defects in the baby.  If you have low calcium intake from food, talk to your health care provider about whether you should take a daily calcium supplement.  Eat four or five small meals rather than three large meals a day.  Limit foods that are high in fat and processed sugars, such as fried and sweet foods.  To prevent constipation: ? Drink enough fluid to keep your urine clear or pale yellow. ? Eat foods that are high in fiber, such as fresh fruits and vegetables, whole grains, and beans. Activity  Exercise only as directed by your health care provider. Most women can continue their usual exercise routine during pregnancy. Try to exercise for 30 minutes at least 5 days a week. Stop exercising if you experience uterine contractions.  Avoid heavy lifting.  Do not exercise in extreme heat or humidity, or at high altitudes.  Wear low-heel, comfortable shoes.  Practice good posture.  You may continue to have sex unless your health care provider tells you otherwise. Relieving pain and discomfort  Take frequent breaks and rest with your legs elevated if you have leg cramps or low back pain.  Take warm sitz baths to soothe any pain or discomfort caused by hemorrhoids. Use hemorrhoid cream if your health care provider approves.  Wear a good support bra to prevent discomfort from breast tenderness.  If you develop varicose veins: ? Wear support  pantyhose or compression stockings as told by your healthcare provider. ? Elevate your  feet for 15 minutes, 3-4 times a day. Prenatal care  Write down your questions. Take them to your prenatal visits.  Keep all your prenatal visits as told by your health care provider. This is important. Safety  Wear your seat belt at all times when driving.  Make a list of emergency phone numbers, including numbers for family, friends, the hospital, and police and fire departments. General instructions  Avoid cat litter boxes and soil used by cats. These carry germs that can cause birth defects in the baby. If you have a cat, ask someone to clean the litter box for you.  Do not travel far distances unless it is absolutely necessary and only with the approval of your health care provider.  Do not use hot tubs, steam rooms, or saunas.  Do not drink alcohol.  Do not use any products that contain nicotine or tobacco, such as cigarettes and e-cigarettes. If you need help quitting, ask your health care provider.  Do not use any medicinal herbs or unprescribed drugs. These chemicals affect the formation and growth of the baby.  Do not douche or use tampons or scented sanitary pads.  Do not cross your legs for long periods of time.  To prepare for the arrival of your baby: ? Take prenatal classes to understand, practice, and ask questions about labor and delivery. ? Make a trial run to the hospital. ? Visit the hospital and tour the maternity area. ? Arrange for maternity or paternity leave through employers. ? Arrange for family and friends to take care of pets while you are in the hospital. ? Purchase a rear-facing car seat and make sure you know how to install it in your car. ? Pack your hospital bag. ? Prepare the baby's nursery. Make sure to remove all pillows and stuffed animals from the baby's crib to prevent suffocation.  Visit your dentist if you have not gone during your pregnancy. Use a  soft toothbrush to brush your teeth and be gentle when you floss. Contact a health care provider if:  You are unsure if you are in labor or if your water has broken.  You become dizzy.  You have mild pelvic cramps, pelvic pressure, or nagging pain in your abdominal area.  You have lower back pain.  You have persistent nausea, vomiting, or diarrhea.  You have an unusual or bad smelling vaginal discharge.  You have pain when you urinate. Get help right away if:  Your water breaks before 37 weeks.  You have regular contractions less than 5 minutes apart before 37 weeks.  You have a fever.  You are leaking fluid from your vagina.  You have spotting or bleeding from your vagina.  You have severe abdominal pain or cramping.  You have rapid weight loss or weight gain.  You have shortness of breath with chest pain.  You notice sudden or extreme swelling of your face, hands, ankles, feet, or legs.  Your baby makes fewer than 10 movements in 2 hours.  You have severe headaches that do not go away when you take medicine.  You have vision changes. Summary  The third trimester is from week 28 through week 40, months 7 through 9. The third trimester is a time when the unborn baby (fetus) is growing rapidly.  During the third trimester, your discomfort may increase as you and your baby continue to gain weight. You may have abdominal, leg, and back pain, sleeping problems, and an increased need to urinate.  During the third trimester your breasts will keep growing and they will continue to become tender. A yellow fluid (colostrum) may leak from your breasts. This is the first milk you are producing for your baby.  False labor is a condition in which you feel small, irregular tightenings of the muscles in the womb (contractions) that eventually go away. These are called Braxton Hicks contractions. Contractions may last for hours, days, or even weeks before true labor sets in.  Signs  of labor can include: abdominal cramps; regular contractions that start at 10 minutes apart and become stronger and more frequent with time; watery or bloody mucus discharge that comes from the vagina; increased pelvic pressure and dull back pain; and leaking of amniotic fluid. This information is not intended to replace advice given to you by your health care provider. Make sure you discuss any questions you have with your health care provider. Document Revised: 10/08/2018 Document Reviewed: 07/23/2016 Elsevier Patient Education  2020 [employer] Group B Streptococcus Test During Pregnancy Why am I having this test? Routine testing, also called screening, for group B streptococcus (GBS) is recommended for all pregnant women between the 36th and 37th week of pregnancy. GBS is a type of bacteria that can be passed from mother to baby during childbirth. Screening will help guide whether or not you will need treatment during labor and delivery to prevent complications such as:  An infection in your uterus during labor.  An infection in your uterus after delivery.  A serious infection in your baby after delivery, such as pneumonia, meningitis, or sepsis. GBS screening is not often done before 36 weeks of pregnancy unless you go into labor prematurely. What happens if I have group B streptococcus? If testing shows that you have GBS, your health care provider will recommend treatment with IV antibiotics during labor and delivery. This treatment significantly decreases the risk of complications for you and your baby. If you have a planned C-section and you have GBS, you may not need to be treated with antibiotics because GBS is usually passed to babies after labor starts and your water breaks. If you are in labor or your water breaks before your C-section, it is possible for GBS to get into your uterus and be passed to your baby, so you might need treatment. Is there a chance I may not need to be  tested? You may not need to be tested for GBS if:  You have a urine test that shows GBS before 36 to 37 weeks.  You had a baby with GBS infection after a previous delivery. In these cases, you will automatically be treated for GBS during labor and delivery. What is being tested? This test is done to check if you have group B streptococcus in your vagina or rectum. What kind of sample is taken? To collect samples for this test, your health care provider will swab your vagina and rectum with a cotton swab. The sample is then sent to the lab to see if GBS is present. What happens during the test?   You will remove your clothing from the waist down.  You will lie down on an exam table in the same position as you would for a pelvic exam.  Your health care provider will swab your vagina and rectum to collect samples for a culture test.  You will be able to go home after the test and do all your usual activities. How are the results reported? The test results are  reported as positive or negative. What do the results mean?  A positive test means you are at risk for passing GBS to your baby during labor and delivery. Your health care provider will recommend that you are treated with an IV antibiotic during labor and delivery.  A negative test means you are at very low risk of passing GBS to your baby. There is still a low risk of passing GBS to your baby because sometimes test results may report that you do not have a condition when you do (false-negative result) or there is a chance that you may become infected with GBS after the test is done. You most likely will not need to be treated with an antibiotic during labor and delivery. Talk with your health care provider about what your results mean. Questions to ask your health care provider Ask your health care provider, or the department that is doing the test:  When will my results be ready?  How will I get my results?  What are my  treatment options? Summary  Routine testing (screening) for group B streptococcus (GBS) is recommended for all pregnant women between the 36th and 37th week of pregnancy.  GBS is a type of bacteria that can be passed from mother to baby during childbirth.  If testing shows that you have GBS, your health care provider will recommend that you are treated with IV antibiotics during labor and delivery. This treatment almost always prevents infection in newborns. This information is not intended to replace advice given to you by your health care provider. Make sure you discuss any questions you have with your health care provider. Document Revised: 10/08/2018 Document Reviewed: 07/15/2018 Elsevier Patient Education  2020 [employer]

## 2019-07-09 NOTE — Progress Notes (Signed)
I connected with Amy Decker on 07/09/19 at  9:30 AM EST by: phone and verified that I am speaking with the correct person using two identifiers.  Patient is located at home and provider is located at [facility].     The purpose of this virtual visit is to provide medical care while limiting exposure to the novel coronavirus. I discussed the limitations, risks, security and privacy concerns of performing an evaluation and management service by phone and the availability of in person appointments. I also discussed with the patient that there may be a patient responsible charge related to this service. By engaging in this virtual visit, you consent to the provision of healthcare.  Additionally, you authorize for your insurance to be billed for the services provided during this visit.  The patient expressed understanding and agreed to proceed.  The following staff members participated in the virtual visit:  Donia Ast, NP    PRENATAL VISIT NOTE  Subjective:  Amy Decker is a 29 y.o. G3P2002 at [redacted]w[redacted]d  for phone visit for ongoing prenatal care.  She is currently monitored for the following issues for this low-risk pregnancy and has Supervision of other normal pregnancy, antepartum; Late prenatal care affecting pregnancy; and Marginal insertion of umbilical cord affecting management of mother on their problem list.  Patient reports no complaints.  Contractions: Irritability. Vag. Bleeding: None.  Movement: Present. Denies leaking of fluid.   The following portions of the patient's history were reviewed and updated as appropriate: allergies, current medications, past family history, past medical history, past social history, past surgical history and problem list.   Objective:   Vitals:   07/09/19 0957  BP: 131/70  Pulse: 91   Self-Obtained  Fetal Status:     Movement: Present     Assessment and Plan:  Pregnancy: G3P2002 at [redacted]w[redacted]d  1. Supervision of other normal pregnancy,  antepartum -GBS 1 week  2. Late prenatal care affecting pregnancy in third trimester  3. Marginal insertion of umbilical cord affecting management of mother -repeat growth [place] with MFM 07/14/2019, pt aware  Preterm labor symptoms and general obstetric precautions including but not limited to vaginal bleeding, contractions, leaking of fluid and fetal movement were reviewed in detail with the patient. I discussed the assessment and treatment plan with the patient. The patient was provided an opportunity to ask questions and all were answered. The patient agreed with the plan and demonstrated an understanding of the instructions. The patient was advised to call back or seek an in-person office evaluation/go to MAU at [facility] for any urgent or concerning symptoms.  Return in about 1 week (around 07/16/2019) for in-person GBS/culutres, help set up MyChart - needs verification.  Future Appointments  Date Time Provider Department Center  07/14/2019 11:15 AM [facility] NURSE [facility]  07/14/2019 11:15 AM [facility] [place] 4 [facility]    Time spent on virtual visit: 5 minutes  Marylen Ponto, NP

## 2019-07-14 ENCOUNTER — Other Ambulatory Visit: Payer: Self-pay

## 2019-07-14 ENCOUNTER — Encounter (HOSPITAL_COMMUNITY): Payer: Self-pay

## 2019-07-14 ENCOUNTER — Ambulatory Visit (HOSPITAL_COMMUNITY): Payer: Medicaid Other | Admitting: *Deleted

## 2019-07-14 ENCOUNTER — Ambulatory Visit (HOSPITAL_COMMUNITY)
Admission: RE | Admit: 2019-07-14 | Discharge: 2019-07-14 | Disposition: A | Discharge status: Home / Self Care | Payer: Medicaid Other | Source: Ambulatory Visit | Attending: Obstetrics and Gynecology | Admitting: Obstetrics and Gynecology

## 2019-07-14 DIAGNOSIS — O43199 Other malformation of placenta, unspecified trimester: Secondary | ICD-10-CM

## 2019-07-14 DIAGNOSIS — O43193 Other malformation of placenta, third trimester: Secondary | ICD-10-CM | POA: Diagnosis not present

## 2019-07-14 DIAGNOSIS — Z362 Encounter for other antenatal screening follow-up: Secondary | ICD-10-CM | POA: Diagnosis present

## 2019-07-14 DIAGNOSIS — Z3A36 36 weeks gestation of pregnancy: Secondary | ICD-10-CM

## 2019-07-14 NOTE — Progress Notes (Signed)
This encounter was created in error - please disregard.

## 2019-07-16 ENCOUNTER — Other Ambulatory Visit: Payer: Self-pay

## 2019-07-16 ENCOUNTER — Other Ambulatory Visit (HOSPITAL_COMMUNITY)
Admission: RE | Admit: 2019-07-16 | Discharge: 2019-07-16 | Disposition: A | Discharge status: Home / Self Care | Payer: Medicaid Other | Source: Ambulatory Visit | Attending: Family Medicine | Admitting: Family Medicine

## 2019-07-16 ENCOUNTER — Ambulatory Visit (INDEPENDENT_AMBULATORY_CARE_PROVIDER_SITE_OTHER): Payer: Medicaid Other | Admitting: Family Medicine

## 2019-07-16 VITALS — BP 108/63 | HR 75 | Wt 127.0 lb

## 2019-07-16 DIAGNOSIS — O43193 Other malformation of placenta, third trimester: Secondary | ICD-10-CM

## 2019-07-16 DIAGNOSIS — Z3A36 36 weeks gestation of pregnancy: Secondary | ICD-10-CM

## 2019-07-16 DIAGNOSIS — Z3480 Encounter for supervision of other normal pregnancy, unspecified trimester: Secondary | ICD-10-CM | POA: Diagnosis present

## 2019-07-16 DIAGNOSIS — O0933 Supervision of pregnancy with insufficient antenatal care, third trimester: Secondary | ICD-10-CM

## 2019-07-16 DIAGNOSIS — O43199 Other malformation of placenta, unspecified trimester: Secondary | ICD-10-CM

## 2019-07-16 NOTE — Progress Notes (Signed)
Subjective:  Amy Decker is a 29 y.o. G3P2002 at [redacted]w[redacted]d being seen today for ongoing prenatal care.  She is currently monitored for the following issues for this low-risk pregnancy and has Supervision of other normal pregnancy, antepartum; Late prenatal care affecting pregnancy; and Marginal insertion of umbilical cord affecting management of mother on their problem list.  Patient reports no complaints.  Contractions: Irritability. Vag. Bleeding: None.  Movement: Present. Denies leaking of fluid.   The following portions of the patient's history were reviewed and updated as appropriate: allergies, current medications, past family history, past medical history, past social history, past surgical history and problem list. Problem list updated.  Objective:   Vitals:   07/16/19 1030  BP: 108/63  Pulse: 75  Weight: 127 lb (57.6 kg)    Fetal Status:   Fundal Height: 35 cm Movement: Present     General:  Alert, oriented and cooperative. Patient is in no acute distress.  Skin: Skin is warm and dry. No rash noted.   Cardiovascular: Normal heart rate noted  Respiratory: Normal respiratory effort, no problems with respiration noted  Abdomen: Soft, gravid, appropriate for gestational age. Pain/Pressure: Present     Pelvic: Vag. Bleeding: None     Cervical exam deferred        Extremities: Normal range of motion.  Edema: None  Mental Status: Normal mood and affect. Normal behavior. Normal judgment and thought content.   Urinalysis:      Assessment and Plan:  Pregnancy: G3P2002 at [redacted]w[redacted]d  1. Supervision of other normal pregnancy, antepartum - Continue routine prenatal care - GBS and GC/CT swabs collected today  2. Late prenatal care affecting pregnancy in third trimester  3. Marginal insertion of umbilical cord affecting management of mother - [place] 07/14/19 showed 14%, EFW 2533g, 5#9, normal amniotic fluid - No further scans scheduled per MFM  Preterm labor symptoms and  general obstetric precautions including but not limited to vaginal bleeding, contractions, leaking of fluid and fetal movement were reviewed in detail with the patient. Please refer to After Visit Summary for other counseling recommendations.  Return in about 1 week (around 07/23/2019) for ROB.   Jhordyn Hoopingarner L, DO

## 2019-07-16 NOTE — Progress Notes (Signed)
ROB with no concerns today.  Pt would like cervix check.

## 2019-07-16 NOTE — Patient Instructions (Signed)
Places to have your son circumcised:                                                                      [facility]     [phone]   $480 while you are in hospital         [facility]              [phone]   $269 by 4 wks                      [facility]                     [phone]   $269 by 7 days [facility]                    [phone]   $269 by 4 wks [facility]             [phone]   $225 by 2 wks    These prices sometimes change but are roughly what you can expect to pay. Please call and confirm pricing.   Circumcision is considered an elective/non-medically necessary procedure. There are many reasons parents decide to have their sons circumsized. During the first year of life circumcised males have a reduced risk of urinary tract infections but after this year the rates between circumcised males and uncircumcised males are the same.  It is safe to have your son circumcised outside of the hospital and the places above perform them regularly.   Deciding about Circumcision in Baby Boys  (Up-to-date The Basics)  What is circumcision?   Circumcision is a surgery that removes the skin that covers the tip of the penis, called the "foreskin" Circumcision is usually done when a boy is between 1 and 10 days old. In the [place], circumcision is common. In some other countries, fewer boys are circumcised. Circumcision is a common tradition in some religions.  Should I have my baby boy circumcised?   There is no easy answer. Circumcision has some benefits. But it also has risks. After talking with your doctor, you will have to decide for yourself what is right for your family.  What are the benefits of circumcision?   Circumcised boys seem to have slightly lower rates of: ?Urinary tract infections ?Swelling of the opening at the tip of the penis Circumcised men seem to have slightly lower rates of: ?Urinary tract infections ?Swelling of the opening at the tip of the penis ?Penis cancer ?HIV  and other infections that you catch during sex ?Cervical cancer in the women they have sex with Even so, in the [place], the risks of these problems are small - even in boys and men who have not been circumcised. Plus, boys and men who are not circumcised can reduce these extra risks by: ?Cleaning their penis well ?Using condoms during sex  What are the risks of circumcision?  Risks include: ?Bleeding or infection from the surgery ?Damage to or amputation of the penis ?A chance that the doctor will cut off too much or not enough of the foreskin ?A chance that sex won't feel as good later in life Only about 1 out of every 200   circumcisions leads to problems. There is also a chance that your health insurance won't pay for circumcision.  How is circumcision done in baby boys?  First, the baby gets medicine for pain relief. This might be a cream on the skin or a shot into the base of the penis. Next, the doctor cleans the baby's penis well. Then he or she uses special tools to cut off the foreskin. Finally, the doctor wraps a bandage (called gauze) around the baby's penis. If you have your baby circumcised, his doctor or nurse will give you instructions on how to care for him after the surgery. It is important that you follow those instructions carefully.  

## 2019-07-18 ENCOUNTER — Encounter (HOSPITAL_COMMUNITY): Payer: Self-pay

## 2019-07-18 ENCOUNTER — Ambulatory Visit (HOSPITAL_COMMUNITY)
Admission: EM | Admit: 2019-07-18 | Discharge: 2019-07-18 | Disposition: A | Discharge status: Home / Self Care | Payer: Medicaid Other | Attending: Physician Assistant | Admitting: Physician Assistant

## 2019-07-18 ENCOUNTER — Other Ambulatory Visit: Payer: Self-pay

## 2019-07-18 DIAGNOSIS — K047 Periapical abscess without sinus: Secondary | ICD-10-CM

## 2019-07-18 DIAGNOSIS — K0889 Other specified disorders of teeth and supporting structures: Secondary | ICD-10-CM | POA: Diagnosis not present

## 2019-07-18 LAB — STREP GP B NAA: Strep Gp B NAA: NEGATIVE

## 2019-07-18 MED ORDER — AMOXICILLIN 500 MG PO CAPS: 500.0000 mg | ORAL_CAPSULE | Freq: Three times a day (TID) | ORAL | 0 refills | Status: AC

## 2019-07-18 NOTE — ED Provider Notes (Signed)
[place]    CSN: [id number] Arrival date & time: 07/18/19  1206      History   Chief Complaint Chief Complaint  Patient presents with  . Dental Pain    HPI Amy Decker is a 29 y.o. female.   Patient is a 29 year old [place] pregnant female patient reporting to urgent care for 1 week of worsening dental pain. The pain is at multiple teeth on right upper side of her mouth. She reports a history of dental problems and that her "teeth are bad". She has not had recent dental care. She reports pain with chewing or drinking currently, but no difficulty swallowing. No fever, chills. Endorses pain shoots into her cheek when it is present. She denies facial swelling. She reports taking sevevarl OTC medications to include ibuprofen, aleeve and tylenol. She states the pain is 10/10 at its worst but 5/10 when taking tylenol.       Past Medical History:  Diagnosis Date  . Peripheral vascular disease [facility])     Patient Active Problem List   Diagnosis Date Noted  . Marginal insertion of umbilical cord affecting management of mother 05/07/2019  . Late prenatal care affecting pregnancy 04/06/2019  . Supervision of other normal pregnancy, antepartum 03/30/2019    Past Surgical History:  Procedure Laterality Date  . CHOLECYSTECTOMY    . TONSILLECTOMY      OB History    Gravida  3   Para  2   Term  2   Preterm      AB      Living  2     SAB      TAB      Ectopic      Multiple      Live Births  2            Home Medications    Prior to Admission medications   Medication Sig Start Date End Date Taking? Authorizing Provider  amoxicillin (AMOXIL) 500 MG capsule Take 1 capsule (500 mg total) by mouth 3 (three) times daily for 10 days. 07/18/19 07/28/19  Janaa Acero, Marguerita Beards, PA-C  Blood Pressure Monitoring (BLOOD PRESSURE KIT) DEVI 1 kit by Does not apply route once a week. Check BP regularly. Large Cuff 03/30/19   Shelly Bombard, MD  omeprazole  (PRILOSEC) 10 MG capsule Take 10 mg by mouth daily.    [provider]  Prenatal Vit-Fe Fumarate-FA (MULTIVITAMIN-PRENATAL) 27-0.8 MG TABS tablet Take 1 tablet by mouth daily at 12 noon.    [provider]    Family History Family History  Problem Relation Age of Onset  . Arthritis Mother   . Cancer Mother   . Hypertension Maternal Grandfather     Social History Social History   Tobacco Use  . Smoking status: Current Every Day Smoker    Types: Cigarettes  . Smokeless tobacco: Never Used  Substance Use Topics  . Alcohol use: Never  . Drug use: Never     Allergies   Patient has no known allergies.   Review of Systems Review of Systems  Constitutional: Negative for chills and fever.  HENT: Positive for dental problem and sinus pain. Negative for drooling, ear pain, facial swelling, mouth sores, sore throat and trouble swallowing.   Cardiovascular: Negative for chest pain and palpitations.  Skin: Negative for rash.  Neurological: Negative for facial asymmetry and headaches.     Physical Exam Triage Vital Signs ED Triage Vitals  Enc Vitals  Group     BP 07/18/19 1254 (!) 107/58     Pulse Rate 07/18/19 1254 70     Resp 07/18/19 1254 16     Temp 07/18/19 1254 97.9 F (36.6 C)     Temp Source 07/18/19 1254 Oral     SpO2 07/18/19 1254 100 %     Weight 07/18/19 1257 135 lb (61.2 kg)     Height --      Head Circumference --      Peak Flow --      Pain Score 07/18/19 1257 10     Pain Loc --      Pain Edu? --      Excl. in [place]? --    No data found.  Updated Vital Signs BP (!) 107/58 (BP Location: Right Arm)   Pulse 70   Temp 97.9 F (36.6 C) (Oral)   Resp 16   Wt 135 lb (61.2 kg)   LMP 10/31/2018 (Approximate)   SpO2 100%   BMI 22.47 kg/m   Visual Acuity Right Eye Distance:   Left Eye Distance:   Bilateral Distance:    Right Eye Near:   Left Eye Near:    Bilateral Near:     Physical Exam Vitals and nursing note reviewed.    Constitutional:      General: She is not in acute distress.    Appearance: Normal appearance. She is well-developed. She is not ill-appearing.  HENT:     Head: Normocephalic and atraumatic.     Mouth/Throat:     Mouth: Mucous membranes are moist. Oral lesions present.     Dentition: Abnormal dentition. Dental tenderness and dental caries present. No dental abscesses.     Tongue: No lesions.     Pharynx: Oropharynx is clear. No pharyngeal swelling or posterior oropharyngeal erythema.      Comments: Peri-apical TTP along all effected teeth. TTP along lower maxillary sinus on right. Eyes:     Conjunctiva/sclera: Conjunctivae normal.  Cardiovascular:     Rate and Rhythm: Normal rate and regular rhythm.     Heart sounds: No murmur.  Pulmonary:     Effort: Pulmonary effort is normal. No respiratory distress.     Breath sounds: Normal breath sounds.  Abdominal:     Palpations: Abdomen is soft.     Tenderness: There is no abdominal tenderness.  Musculoskeletal:     Cervical back: Neck supple.     Right lower leg: No edema.     Left lower leg: No edema.  Skin:    General: Skin is warm and dry.  Neurological:     General: No focal deficit present.     Mental Status: She is alert and oriented to person, place, and time.  Psychiatric:        Mood and Affect: Mood normal.        Behavior: Behavior normal.        Thought Content: Thought content normal.        Judgment: Judgment normal.      UC Treatments / Results  Labs (all labs ordered are listed, but only abnormal results are displayed) Labs Reviewed - No data to display  EKG   Radiology No results found.  Procedures Procedures (including critical care time)  Medications Ordered in UC Medications - No data to display  Initial Impression / Assessment and Plan / UC Course  I have reviewed the triage vital signs and the nursing notes.  Pertinent labs & imaging  results that were available during my care of the patient  were reviewed by me and considered in my medical decision making (see chart for details).     #Dental pain #Dental infection - Very poor dentition. Requires dental intervention for tooth pulling and continued care. No fevers. Given pregnancy, will continue tylenol and sent amox. Strictly discussed needing to stop all NSAID use. Dental clinic sheet given with affordable/low cost options.  - return precautions discussed  Final Clinical Impressions(s) / UC Diagnoses   Final diagnoses:  Dental infection  Pain, dental     Discharge Instructions     Stop taking ibuprofen and aleeve, these are not recommended at this point in your pregnancy.  You may continue to take tylenol 2x 351m tablets every 6 hours for pain  The antibiotic I have prescribed will also help alleviate some of the pain.  I have attached information for affordable dental clinics, please reach out to one of theses.  If you develop fever, significantly worsening pain, difficulty swallowing, please be re-evaluated at urgent care or the emergency department.      ED Prescriptions    Medication Sig Dispense Auth. Provider   amoxicillin (AMOXIL) 500 MG capsule Take 1 capsule (500 mg total) by mouth 3 (three) times daily for 10 days. 30 capsule Basel Defalco, JMarguerita Beards PA-C     I have reviewed the PDMP during this encounter.   DPurnell Shoemaker PA-C 07/18/19 1344

## 2019-07-18 NOTE — ED Triage Notes (Signed)
Pt states she has a toothache x 3 days. Pt states she has been using the OTC med and they are working for the time being.pt thinks ahe may have a infection. Pt is [redacted] weeks pregnant.

## 2019-07-18 NOTE — Discharge Instructions (Signed)
Stop taking ibuprofen and aleeve, these are not recommended at this point in your pregnancy.  You may continue to take tylenol 2x 325mg  tablets every 6 hours for pain  The antibiotic I have prescribed will also help alleviate some of the pain.  I have attached information for affordable dental clinics, please reach out to one of theses.  If you develop fever, significantly worsening pain, difficulty swallowing, please be re-evaluated at urgent care or the emergency department.

## 2019-07-19 LAB — CERVICOVAGINAL ANCILLARY ONLY
Chlamydia: NEGATIVE
Comment: NEGATIVE
Comment: NEGATIVE
Comment: NORMAL
Neisseria Gonorrhea: NEGATIVE
Trichomonas: NEGATIVE

## 2019-07-27 ENCOUNTER — Telehealth: Payer: Self-pay

## 2019-08-05 ENCOUNTER — Inpatient Hospital Stay (HOSPITAL_COMMUNITY): Payer: Medicaid Other | Admitting: Anesthesiology

## 2019-08-05 ENCOUNTER — Telehealth: Payer: Self-pay

## 2019-08-05 ENCOUNTER — Encounter (HOSPITAL_COMMUNITY): Payer: Self-pay | Admitting: Obstetrics and Gynecology

## 2019-08-05 ENCOUNTER — Other Ambulatory Visit: Payer: Self-pay

## 2019-08-05 ENCOUNTER — Inpatient Hospital Stay (HOSPITAL_COMMUNITY)
Admission: AD | Admit: 2019-08-05 | Discharge: 2019-08-07 | DRG: 807 | Disposition: A | Discharge status: Home / Self Care | Payer: Medicaid Other | Attending: Obstetrics and Gynecology | Admitting: Obstetrics and Gynecology

## 2019-08-05 DIAGNOSIS — O43199 Other malformation of placenta, unspecified trimester: Secondary | ICD-10-CM

## 2019-08-05 DIAGNOSIS — Z20822 Contact with and (suspected) exposure to covid-19: Secondary | ICD-10-CM | POA: Diagnosis present

## 2019-08-05 DIAGNOSIS — F17210 Nicotine dependence, cigarettes, uncomplicated: Secondary | ICD-10-CM | POA: Diagnosis present

## 2019-08-05 DIAGNOSIS — O4292 Full-term premature rupture of membranes, unspecified as to length of time between rupture and onset of labor: Principal | ICD-10-CM | POA: Diagnosis present

## 2019-08-05 DIAGNOSIS — O99334 Smoking (tobacco) complicating childbirth: Secondary | ICD-10-CM | POA: Diagnosis present

## 2019-08-05 DIAGNOSIS — Z3A39 39 weeks gestation of pregnancy: Secondary | ICD-10-CM

## 2019-08-05 DIAGNOSIS — O43123 Velamentous insertion of umbilical cord, third trimester: Secondary | ICD-10-CM | POA: Diagnosis present

## 2019-08-05 DIAGNOSIS — O4290 Premature rupture of membranes, unspecified as to length of time between rupture and onset of labor, unspecified weeks of gestation: Secondary | ICD-10-CM | POA: Diagnosis present

## 2019-08-05 LAB — TYPE AND SCREEN
ABO/RH(D): A POS
Antibody Screen: NEGATIVE

## 2019-08-05 LAB — POCT FERN TEST
POCT Fern Test: NEGATIVE
POCT Fern Test: POSITIVE

## 2019-08-05 LAB — CBC
HCT: 36.8 % (ref 36.0–46.0)
Hemoglobin: 12.3 g/dL (ref 12.0–15.0)
MCH: 33.3 pg (ref 26.0–34.0)
MCHC: 33.4 g/dL (ref 30.0–36.0)
MCV: 99.7 fL (ref 80.0–100.0)
Platelets: 185 10*3/uL (ref 150–400)
RBC: 3.69 MIL/uL — ABNORMAL LOW (ref 3.87–5.11)
RDW: 13.9 % (ref 11.5–15.5)
WBC: 17.6 10*3/uL — ABNORMAL HIGH (ref 4.0–10.5)
nRBC: 0 % (ref 0.0–0.2)

## 2019-08-05 LAB — SARS CORONAVIRUS 2 (TAT 6-24 HRS): SARS Coronavirus 2: NEGATIVE

## 2019-08-05 LAB — ABO/RH: ABO/RH(D): A POS

## 2019-08-05 MED ORDER — ONDANSETRON HCL 4 MG/2ML IJ SOLN: 4.0000 mg | Freq: Four times a day (QID) | INTRAMUSCULAR | Status: DC | PRN

## 2019-08-05 MED ORDER — LACTATED RINGERS IV SOLN: INTRAVENOUS | Status: DC

## 2019-08-05 MED ORDER — TERBUTALINE SULFATE 1 MG/ML IJ SOLN: 0.2500 mg | Freq: Once | INTRAMUSCULAR | Status: DC | PRN

## 2019-08-05 MED ORDER — DIPHENHYDRAMINE HCL 50 MG/ML IJ SOLN: 12.5000 mg | INTRAMUSCULAR | Status: DC | PRN

## 2019-08-05 MED ORDER — LACTATED RINGERS IV SOLN: 500.0000 mL | INTRAVENOUS | Status: DC | PRN

## 2019-08-05 MED ORDER — PHENYLEPHRINE 40 MCG/ML (10ML) SYRINGE FOR IV PUSH (FOR BLOOD PRESSURE SUPPORT): 80.0000 ug | PREFILLED_SYRINGE | INTRAVENOUS | Status: DC | PRN

## 2019-08-05 MED ORDER — SOD CITRATE-CITRIC ACID 500-334 MG/5ML PO SOLN: 30.0000 mL | ORAL | Status: DC | PRN

## 2019-08-05 MED ORDER — LIDOCAINE HCL (PF) 1 % IJ SOLN
INTRAMUSCULAR | Status: DC | PRN
  Administered 2019-08-05 (×2): 4 mL via EPIDURAL

## 2019-08-05 MED ORDER — LACTATED RINGERS IV SOLN
500.0000 mL | Freq: Once | INTRAVENOUS | Status: AC
  Administered 2019-08-05: 22:00:00 500 mL via INTRAVENOUS

## 2019-08-05 MED ORDER — FENTANYL CITRATE (PF) 100 MCG/2ML IJ SOLN
100.0000 ug | INTRAMUSCULAR | Status: DC | PRN
  Administered 2019-08-05 (×4): 100 ug via INTRAVENOUS
  Filled 2019-08-05 (×3): qty 2

## 2019-08-05 MED ORDER — ACETAMINOPHEN 325 MG PO TABS: 650.0000 mg | ORAL_TABLET | ORAL | Status: DC | PRN

## 2019-08-05 MED ORDER — OXYTOCIN 40 UNITS IN NORMAL SALINE INFUSION - SIMPLE MED
1.0000 m[IU]/min | INTRAVENOUS | Status: DC
  Administered 2019-08-05: 2 m[IU]/min via INTRAVENOUS

## 2019-08-05 MED ORDER — SODIUM CHLORIDE (PF) 0.9 % IJ SOLN
INTRAMUSCULAR | Status: DC | PRN
  Administered 2019-08-05: 12 mL/h via EPIDURAL

## 2019-08-05 MED ORDER — FENTANYL-BUPIVACAINE-NACL 0.5-0.125-0.9 MG/250ML-% EP SOLN
12.0000 mL/h | EPIDURAL | Status: DC | PRN
  Filled 2019-08-05: qty 250

## 2019-08-05 MED ORDER — OXYTOCIN BOLUS FROM INFUSION
500.0000 mL | Freq: Once | INTRAVENOUS | Status: AC
  Administered 2019-08-05: 500 mL via INTRAVENOUS

## 2019-08-05 MED ORDER — EPHEDRINE 5 MG/ML INJ: 10.0000 mg | INTRAVENOUS | Status: DC | PRN

## 2019-08-05 MED ORDER — LIDOCAINE HCL (PF) 1 % IJ SOLN: 30.0000 mL | INTRAMUSCULAR | Status: DC | PRN

## 2019-08-05 MED ORDER — OXYTOCIN 40 UNITS IN NORMAL SALINE INFUSION - SIMPLE MED
2.5000 [IU]/h | INTRAVENOUS | Status: DC
  Administered 2019-08-06: 2.5 [IU]/h via INTRAVENOUS
  Filled 2019-08-05: qty 1000

## 2019-08-05 MED ORDER — LACTATED RINGERS IV SOLN: 500.0000 mL | Freq: Once | INTRAVENOUS | Status: DC

## 2019-08-05 MED ORDER — PHENYLEPHRINE 40 MCG/ML (10ML) SYRINGE FOR IV PUSH (FOR BLOOD PRESSURE SUPPORT)
80.0000 ug | PREFILLED_SYRINGE | INTRAVENOUS | Status: DC | PRN
  Filled 2019-08-05: qty 10

## 2019-08-05 MED ORDER — OXYCODONE-ACETAMINOPHEN 5-325 MG PO TABS: 2.0000 | ORAL_TABLET | ORAL | Status: DC | PRN

## 2019-08-05 MED ORDER — OXYCODONE-ACETAMINOPHEN 5-325 MG PO TABS: 1.0000 | ORAL_TABLET | ORAL | Status: DC | PRN

## 2019-08-05 MED ORDER — FENTANYL CITRATE (PF) 100 MCG/2ML IJ SOLN
INTRAMUSCULAR | Status: AC
  Filled 2019-08-05: qty 2

## 2019-08-05 NOTE — Telephone Encounter (Signed)
Pt called and reports that her water broke this morning. Pt denies contractions, reports good fetal movement. I advised pt to go to the hospital for evaluation of labor. Pt voices understanding, says she will go now.

## 2019-08-05 NOTE — Anesthesia Procedure Notes (Signed)
Epidural Patient location during procedure: OB Start time: 08/05/2019 9:48 PM End time: 08/05/2019 9:56 PM  Staffing Anesthesiologist: Mal Amabile, MD Performed: anesthesiologist   Preanesthetic Checklist Completed: patient identified, IV checked, site marked, risks and benefits discussed, surgical consent, monitors and equipment checked, pre-op evaluation and timeout performed  Epidural Patient position: sitting Prep: DuraPrep and site prepped and draped Patient monitoring: continuous pulse ox and blood pressure Approach: midline Location: L3-L4 Injection technique: LOR air  Needle:  Needle type: Tuohy  Needle gauge: 17 G Needle length: 9 cm and 9 Needle insertion depth: 4 cm Catheter type: closed end flexible Catheter size: 19 Gauge Catheter at skin depth: 9 cm Test dose: negative and Other  Assessment Events: blood not aspirated, injection not painful, no injection resistance, no paresthesia and negative IV test  Additional Notes Patient identified. Risks and benefits discussed including failed block, incomplete  Pain control, post dural puncture headache, nerve damage, paralysis, blood pressure Changes, nausea, vomiting, reactions to medications-both toxic and allergic and post Partum back pain. All questions were answered. Patient expressed understanding and wished to proceed. Sterile technique was used throughout procedure. Epidural site was Dressed with sterile barrier dressing. No paresthesias, signs of intravascular injection Or signs of intrathecal spread were encountered.  Patient was more comfortable after the epidural was dosed. Please see RN's note for documentation of vital signs and FHR which are stable. Reason for block:procedure for pain

## 2019-08-05 NOTE — Anesthesia Preprocedure Evaluation (Addendum)
Anesthesia Evaluation  Patient identified by MRN, date of birth, ID band Patient awake    Reviewed: Allergy & Precautions, Patient's Chart, lab work & pertinent test results  Airway Mallampati: II  TM Distance: >3 FB Neck ROM: Full    Dental no notable dental hx. (+) Teeth Intact   Pulmonary Current Smoker,    Pulmonary exam normal breath sounds clear to auscultation       Cardiovascular + Peripheral Vascular Disease   Rhythm:Regular Rate:Normal     Neuro/Psych negative neurological ROS  negative psych ROS   GI/Hepatic Neg liver ROS, GERD  Medicated and Controlled,  Endo/Other  negative endocrine ROS  Renal/GU negative Renal ROS  negative genitourinary   Musculoskeletal negative musculoskeletal ROS (+)   Abdominal   Peds  Hematology negative hematology ROS (+)   Anesthesia Other Findings Piercings face, nose and lip  Reproductive/Obstetrics (+) Pregnancy                             Anesthesia Physical Anesthesia Plan  ASA: II  Anesthesia Plan: Epidural   Post-op Pain Management:    Induction:   PONV Risk Score and Plan:   Airway Management Planned: Natural Airway  Additional Equipment:   Intra-op Plan:   Post-operative Plan:   Informed Consent: I have reviewed the patients History and Physical, chart, labs and discussed the procedure including the risks, benefits and alternatives for the proposed anesthesia with the patient or authorized representative who has indicated his/her understanding and acceptance.       Plan Discussed with: Anesthesiologist  Anesthesia Plan Comments:         Anesthesia Quick Evaluation

## 2019-08-05 NOTE — Progress Notes (Signed)
LABOR PROGRESS NOTE  Charl Wellen is a 29 y.o. female [phone] with IUP at 104w5d by LMP presenting for induction 2/2 to premature rupture of membranes.  Subjective: Called to bedside for prolonged deceleration with a heart rate in the 90s. Patient changed to right lateral recumbent position. Fluid bolus administered. Fetal scalp electrode placed with resolution of heart rate back to the 140s. Patient in mild discomfort.  Objective: BP 119/68   Pulse 70   Temp 97.6 F (36.4 C)   Resp 16   Ht 5\' 5"  (1.651 m)   Wt 59.9 kg   LMP 10/31/2018 (Approximate)   SpO2 98%   BMI 21.97 kg/m    Dilation: 4 Effacement (%): 80 Cervical Position: Middle Station: -1 Presentation: Vertex(via ultrasound) Exam by:: [email or web address]   Fetal monitoring: Baseline: 149 bpm, Variability: Good {> 6 bpm), Accelerations: Reactive, and Decelerations: Absent Uterine activity: Frequency: Every 2-4 minutes, Duration: 80 seconds, and Intensity: mild  Labs: Lab Results  Component Value Date   WBC 17.6 (H) 08/05/2019   HGB 12.3 08/05/2019   HCT 36.8 08/05/2019   MCV 99.7 08/05/2019   PLT 185 08/05/2019    Patient Active Problem List   Diagnosis Date Noted  . Premature rupture of membranes 08/05/2019  . Marginal insertion of umbilical cord affecting management of mother 05/07/2019  . Late prenatal care affecting pregnancy 04/06/2019  . Supervision of other normal pregnancy, antepartum 03/30/2019    Assessment / Plan: Nyrah Demos is a 29 y.o. female G3P2002 with IUP at [redacted]w[redacted]d by LMP presenting for induction 2/2 to premature rupture of membranes.  #Premautre Rupture of Membranes: Induction as below #Labor: Started pitocin 1442 #Fetal Wellbeing:  Category I #Pain Control: Epidural and IV pain meds  #GBS/ID: Negative #COVID: Negative #MOF: Bottle #MOC: Depo #Circ: Inpatient (payment pending) #Anticipated MOD: NSVD #Marginal Cord: last U/S noted in 07/14/2019  07/16/2019, DO,  PGY-1 Family Medicine Resident, [facility] Faculty Teaching Service  08/05/2019, 6:11 PM

## 2019-08-05 NOTE — MAU Note (Signed)
Pt is G3P2 at [redacted]w[redacted]d reports possible SROM at 0815 this morning, clear fluid. Denies VB. +FM

## 2019-08-05 NOTE — Progress Notes (Signed)
Labor Progress Note Amy Decker is a 29 y.o. G3P2002 at [redacted]w[redacted]d presented for IOL d/t PROM S: Patient got epidural, now comfortable  O:  BP (!) 90/50   Pulse 71   Temp 97.7 F (36.5 C) (Oral)   Resp 16   Ht 5\' 5"  (1.651 m)   Wt 59.9 kg   LMP 10/31/2018 (Approximate)   SpO2 98%   BMI 21.97 kg/m  EFM: 130 bpm/+accels/-decels  CVE: Dilation: 6 Effacement (%): 90 Cervical Position: Posterior Station: Plus 1 Presentation: Vertex Exam by:: [email or web address] RN   A&P: 29 y.o. 26 [redacted]w[redacted]d here for IOL d/t PROM #Labor: Progressing well. Continue pitocin, currently at 68mu/min #Pain: Epidural #FWB: Cat I #GBS negative  9m, MD 10:38 PM

## 2019-08-05 NOTE — Discharge Summary (Addendum)
Postpartum Discharge Summary  Patient Name: Amy Decker DOB: 02-May-1991 MRN: [id number]  Date of admission: 08/05/2019 Delivering Provider: Gladys Damme   Date of discharge: 08/07/2019  Admitting diagnosis: Premature rupture of membranes [O42.90] Intrauterine pregnancy: [redacted]w[redacted]d    Secondary diagnosis:  Active Problems:   Premature rupture of membranes  Additional problems: none     Discharge diagnosis: Term Pregnancy Delivered                                                                                                Post partum procedures:depo  Augmentation: Pitocin  Complications: None  Hospital course:  Onset of Labor With Vaginal Delivery     29y.o. yo [id number] 370w5das admitted in Latent Labor on 08/05/2019. Patient had an uncomplicated labor course as follows:  Membrane Rupture Time/Date: 8:30 AM ,08/05/2019   Intrapartum Procedures: Episiotomy: None [1]                                         Lacerations:  None [1]  Patient had a delivery of a Viable infant. 08/05/2019  Information for the patient's newborn:  GrAlyson, Ki0[[id number]]Delivery Method: Vaginal, Spontaneous(Filed from Delivery Summary)     Pateint had an uncomplicated postpartum course.  She is ambulating, tolerating a regular diet, passing flatus, and urinating well. Patient has been having intense uterine cramping not helped by ibuprofen. Short course of Oxycodone 36m32mO q4h PRN given. Patient is discharged home in stable condition on 08/07/19.  Delivery time: 11:39 PM    Magnesium Sulfate received: No BMZ received: No Rhophylac:No MMR:No Transfusion:No  Physical exam  Vitals:   08/06/19 1120 08/06/19 1806 08/06/19 2014 08/07/19 0449  BP: 113/65 105/73 (!) 107/53 112/75  Pulse: 62 (!) 56 62 70  Resp: 18 17 18    Temp: 98.1 F (36.7 C) 97.9 F (36.6 C) 97.8 F (36.6 C) 98.2 F (36.8 C)  TempSrc: Oral Oral Oral Oral  SpO2: 99% 100%    Weight:      Height:        General: alert, cooperative and no distress Lochia: appropriate Uterine Fundus: firm Incision: N/A DVT Evaluation: No evidence of DVT seen on physical exam. No cords or calf tenderness. No significant calf/ankle edema. Labs: Lab Results  Component Value Date   WBC 17.6 (H) 08/05/2019   HGB 12.3 08/05/2019   HCT 36.8 08/05/2019   MCV 99.7 08/05/2019   PLT 185 08/05/2019   No flowsheet data found.  Discharge instruction: per After Visit Summary and "Baby and Me Booklet".  After visit meds:  Allergies as of 08/07/2019   No Known Allergies     Medication List    TAKE these medications   acetaminophen 325 MG tablet Commonly known as: Tylenol Take 2 tablets (650 mg total) by mouth every 4 (four) hours as needed (for pain scale < 4).   Blood Pressure Kit Devi 1 kit by Does not apply route once a week. Check  BP regularly. Large Cuff   omeprazole 20 MG capsule Commonly known as: PRILOSEC Take 20 mg by mouth daily.   oxyCODONE 5 MG immediate release tablet Commonly known as: Oxy IR/ROXICODONE Take 1 tablet (5 mg total) by mouth every 4 (four) hours as needed for up to 5 days (pain scale 4-7).   prenatal multivitamin Tabs tablet Take 1 tablet by mouth daily at 12 noon.       Diet: routine diet  Activity: Advance as tolerated. Pelvic rest for 6 weeks.   Outpatient follow up:4 weeks Follow up Appt: Future Appointments  Date Time Provider [place]  09/06/2019 10:15 AM [place]-Kirby, Kathie Dike, CNM [facility] None   Follow up Visit:    Please schedule this patient for Postpartum visit in: 4 weeks with the following provider: Any provider Virtual For C/S patients schedule nurse incision check in weeks 2 weeks: no Low risk pregnancy complicated by:  Delivery mode:  SVD Anticipated Birth Control:  PP Depo given PP Procedures needed:   Schedule Integrated BH visit: no     Newborn Data: Live born female  Birth Weight:  2765 APGAR: 53 ,9   Newborn Delivery    Birth date/time: 08/05/2019 23:39:00 Delivery type: Vaginal, Spontaneous      Baby Feeding: Bottle Disposition:home with mother   08/07/2019 Gladys Damme, MD

## 2019-08-05 NOTE — H&P (Addendum)
LABOR AND DELIVERY ADMISSION HISTORY AND PHYSICAL NOTE  Amy Decker is a 29 y.o. female [phone] with IUP at 75w5dby LMP presenting for induction 2/2 to premature rupture of membranes. She states that she felt a gush of fluids at 0830 that appeared clear with no blood. She is feeling occasional contractions that are not painful. She reports no complications during this pregnancy other than a marginal cord. In her first delivery she did require episiotomy and vacuum assisted delivery. Second delivery was unremarkable.   She reports positive fetal movement. No vaginal bleeding, only occasional contractions.   She plans on bottle feeding. Her contraception plan is: depoprovera.  [facility][redacted[facility] , CWD, normal anatomy, cephalic presentation, anterior placenta, 14%ile, EFW [id number] Pregnancy complications:  - Marginal Cord  Past Medical History: Past Medical History:  Diagnosis Date  . Peripheral vascular disease ([facility]     Past Surgical History: Past Surgical History:  Procedure Laterality Date  . CHOLECYSTECTOMY    . TONSILLECTOMY      Obstetrical History: OB History    Gravida  3   Para  2   Term  2   Preterm      AB      Living  2     SAB      TAB      Ectopic      Multiple      Live Births  2           Social History: Social History   Socioeconomic History  . Marital status: Single    Spouse name: Not on file  . Number of children: Not on file  . Years of education: Not on file  . Highest education level: Not on file  Occupational History  . Occupation: [employer]  Tobacco Use  . Smoking status: Current Every Day Smoker    Types: Cigarettes  . Smokeless tobacco: Never Used  Substance and Sexual Activity  . Alcohol use: Never  . Drug use: Never  . Sexual activity: Yes    Birth control/protection: None  Other Topics Concern  . Not on file  Social History Narrative  . Not on file    Social Determinants of Health   Financial Resource Strain:   . Difficulty of Paying Living Expenses: Not on file  Food Insecurity:   . Worried About RCharity fundraiserin the Last Year: Not on file  . Ran Out of Food in the Last Year: Not on file  Transportation Needs:   . Lack of Transportation (Medical): Not on file  . Lack of Transportation (Non-Medical): Not on file  Physical Activity:   . Days of Exercise per Week: Not on file  . Minutes of Exercise per Session: Not on file  Stress:   . Feeling of Stress : Not on file  Social Connections:   . Frequency of Communication with Friends and Family: Not on file  . Frequency of Social Gatherings with Friends and Family: Not on file  . Attends Religious Services: Not on file  . Active Member of Clubs or Organizations: Not on file  . Attends CArchivistMeetings: Not on file  . Marital Status: Not on file    Family History: Family History  Problem Relation Age of Onset  . Arthritis Mother   . Cancer Mother   . Hypertension Maternal Grandfather     Allergies: No Known Allergies  Medications Prior to Admission  Medication  Sig Dispense Refill Last Dose  . omeprazole (PRILOSEC) 10 MG capsule Take 10 mg by mouth daily.   08/04/2019 at Unknown time  . Prenatal Vit-Fe Fumarate-FA (MULTIVITAMIN-PRENATAL) 27-0.8 MG TABS tablet Take 1 tablet by mouth daily at 12 noon.   08/04/2019 at Unknown time  . Blood Pressure Monitoring (BLOOD PRESSURE KIT) DEVI 1 kit by Does not apply route once a week. Check BP regularly. Large Cuff 1 kit 0      Review of Systems  All systems reviewed and negative except as stated in HPI  Physical Exam Blood pressure 112/77, pulse 98, temperature (!) 97.2 F (36.2 C), temperature source Oral, resp. rate 16, last menstrual period 10/31/2018, SpO2 98 %. General appearance: alert, oriented, NAD Lungs: normal respiratory effort Heart: regular rate Abdomen: soft, non-tender; gravid, leopolds  cephalic Extremities: No calf swelling or tenderness Presentation: cephalic by leopolds  Fetal monitoring: Baseline: 130 bpm, Variability: Good {> 6 bpm), Accelerations: Reactive and Decelerations: Absent Uterine activity: Occasional  Dilation: 2.5 Effacement (%): 70 Station: -3 Exam by:: T LYTLE RN   Prenatal labs: ABO, Rh: A/Positive/-- (10/06 1522) Antibody: Negative (10/06 1522) Rubella: 1.61 (10/06 1522) RPR: Non Reactive (11/10 1048)  HBsAg: Negative (10/06 1522)  HIV: Non Reactive (11/10 1048)  GC/Chlamydia: Negative GBS: --[place]end[place] Cloud (01/15 1034)  2-hr GTT: Negative Genetic screening: Low risk panorama Anatomy US: Normal  Prenatal Transfer Tool  Maternal Diabetes: No Genetic Screening: Normal Maternal Ultrasounds/Referrals: Normal Fetal Ultrasounds or other Referrals:  None Maternal Substance Abuse:  No Significant Maternal Medications:  None Significant Maternal Lab Results: Group B Strep negative  Results for orders placed or performed during the hospital encounter of 08/05/19 (from the past 24 hour(s))  POCT fern test   Collection Time: 08/05/19 11:04 AM  Result Value Ref Range   POCT Fern Test Negative = intact amniotic membranes   Fern Test   Collection Time: 08/05/19 11:06 AM  Result Value Ref Range   POCT Fern Test Positive = ruptured amniotic membanes     Patient Active Problem List   Diagnosis Date Noted  . Marginal insertion of umbilical cord affecting management of mother 05/07/2019  . Late prenatal care affecting pregnancy 04/06/2019  . Supervision of other normal pregnancy, antepartum 03/30/2019    Assessment: Amy Decker is a 29 y.o. female G3P2002 with IUP at 57w5dby LMP presenting for induction 2/2 to premature rupture of membranes.   #Premautre Rupture of Membranes: Induction as below #Labor: Starting pitocin after she eats #Fetal Wellbeing:  Category I #Pain Control: Epidural and IV pain meds #GBS/ID: Negative #COVID: swab  pending #MOF: Bottle #MOC: Depo #Circ: Inpatient (payment pending) #Anticipated MOD: NSVD #Marginal Cord: last U/S noted in 07/14/2019  JGlenna Durand DO, PGY-1 Family Medicine Resident, [facility]als[facility] Teaching Service  08/05/2019, 11:24 AM    OB FELLOW ATTESTATION  I have seen and examined this patient and edited the above documentation in the resident's note to reflect any changes or updates.  MAugustin Coupe MD/MPH OB Fellow  08/05/2019, 4:51 PM

## 2019-08-06 ENCOUNTER — Encounter (HOSPITAL_COMMUNITY): Payer: Self-pay | Admitting: Obstetrics and Gynecology

## 2019-08-06 DIAGNOSIS — Z1379 Encounter for other screening for genetic and chromosomal anomalies: Secondary | ICD-10-CM | POA: Insufficient documentation

## 2019-08-06 LAB — RPR: RPR Ser Ql: NONREACTIVE

## 2019-08-06 MED ORDER — ACETAMINOPHEN 325 MG PO TABS
650.0000 mg | ORAL_TABLET | ORAL | Status: DC | PRN
  Administered 2019-08-06 – 2019-08-07 (×3): 650 mg via ORAL
  Filled 2019-08-06 (×4): qty 2

## 2019-08-06 MED ORDER — OXYCODONE HCL 5 MG PO TABS
10.0000 mg | ORAL_TABLET | ORAL | Status: DC | PRN
  Filled 2019-08-06: qty 2

## 2019-08-06 MED ORDER — MEDROXYPROGESTERONE ACETATE 150 MG/ML IM SUSP
150.0000 mg | INTRAMUSCULAR | Status: AC | PRN
  Administered 2019-08-07: 13:00:00 150 mg via INTRAMUSCULAR
  Filled 2019-08-06: qty 1

## 2019-08-06 MED ORDER — TETANUS-DIPHTH-ACELL PERTUSSIS 5-2.5-18.5 LF-MCG/0.5 IM SUSP: 0.5000 mL | Freq: Once | INTRAMUSCULAR | Status: DC

## 2019-08-06 MED ORDER — ZOLPIDEM TARTRATE 5 MG PO TABS: 5.0000 mg | ORAL_TABLET | Freq: Every evening | ORAL | Status: DC | PRN

## 2019-08-06 MED ORDER — ONDANSETRON HCL 4 MG PO TABS: 4.0000 mg | ORAL_TABLET | ORAL | Status: DC | PRN

## 2019-08-06 MED ORDER — SENNOSIDES-DOCUSATE SODIUM 8.6-50 MG PO TABS
2.0000 | ORAL_TABLET | ORAL | Status: DC
  Administered 2019-08-06 (×2): 2 via ORAL
  Filled 2019-08-06 (×2): qty 2

## 2019-08-06 MED ORDER — OXYCODONE HCL 5 MG PO TABS
5.0000 mg | ORAL_TABLET | ORAL | Status: DC | PRN
  Administered 2019-08-06 – 2019-08-07 (×6): 5 mg via ORAL
  Filled 2019-08-06 (×6): qty 1

## 2019-08-06 MED ORDER — WITCH HAZEL-GLYCERIN EX PADS: 1.0000 "application " | MEDICATED_PAD | CUTANEOUS | Status: DC | PRN

## 2019-08-06 MED ORDER — BENZOCAINE-MENTHOL 20-0.5 % EX AERO
1.0000 "application " | INHALATION_SPRAY | CUTANEOUS | Status: DC | PRN
  Administered 2019-08-06: 1 via TOPICAL
  Filled 2019-08-06: qty 56

## 2019-08-06 MED ORDER — DIPHENHYDRAMINE HCL 25 MG PO CAPS: 25.0000 mg | ORAL_CAPSULE | Freq: Four times a day (QID) | ORAL | Status: DC | PRN

## 2019-08-06 MED ORDER — ONDANSETRON HCL 4 MG/2ML IJ SOLN: 4.0000 mg | INTRAMUSCULAR | Status: DC | PRN

## 2019-08-06 MED ORDER — DIBUCAINE (PERIANAL) 1 % EX OINT: 1.0000 "application " | TOPICAL_OINTMENT | CUTANEOUS | Status: DC | PRN

## 2019-08-06 MED ORDER — KETOROLAC TROMETHAMINE 10 MG PO TABS
10.0000 mg | ORAL_TABLET | Freq: Four times a day (QID) | ORAL | Status: DC
  Administered 2019-08-06 – 2019-08-07 (×5): 10 mg via ORAL
  Filled 2019-08-06 (×5): qty 1

## 2019-08-06 MED ORDER — COCONUT OIL OIL: 1.0000 "application " | TOPICAL_OIL | Status: DC | PRN

## 2019-08-06 MED ORDER — IBUPROFEN 600 MG PO TABS
600.0000 mg | ORAL_TABLET | Freq: Four times a day (QID) | ORAL | Status: DC
  Administered 2019-08-06 (×2): 600 mg via ORAL
  Filled 2019-08-06 (×2): qty 1

## 2019-08-06 MED ORDER — SIMETHICONE 80 MG PO CHEW: 80.0000 mg | CHEWABLE_TABLET | ORAL | Status: DC | PRN

## 2019-08-06 MED ORDER — PRENATAL MULTIVITAMIN CH
1.0000 | ORAL_TABLET | Freq: Every day | ORAL | Status: DC
  Administered 2019-08-06 – 2019-08-07 (×2): 1 via ORAL
  Filled 2019-08-06 (×2): qty 1

## 2019-08-06 NOTE — Anesthesia Postprocedure Evaluation (Signed)
Anesthesia Post Note  Patient: Amy Decker  Procedure(s) Performed: AN AD HOC LABOR EPIDURAL     Patient location during evaluation: Mother Baby Anesthesia Type: Epidural Level of consciousness: awake and alert Pain management: pain level controlled Vital Signs Assessment: post-procedure vital signs reviewed and stable Respiratory status: spontaneous breathing, nonlabored ventilation and respiratory function stable Cardiovascular status: stable Postop Assessment: no headache, no backache and epidural receding Anesthetic complications: no    Last Vitals:  Vitals:   08/06/19 0741 08/06/19 1120  BP:  113/65  Pulse: 61 62  Resp: 17 18  Temp:  36.7 C  SpO2: 100% 99%    Last Pain:  Vitals:   08/06/19 1251  TempSrc:   PainSc: 4    Pain Goal: Patients Stated Pain Goal: 0 (08/05/19 2030)                 Tyrie Porzio

## 2019-08-06 NOTE — Progress Notes (Addendum)
POSTPARTUM PROGRESS NOTE  Post Partum Day #1  Subjective:  Amy Decker is a 29 y.o. G3P3003 on PPD#1 s/p SVD w/ IOL d/t PROM at [redacted]w[redacted]d.  She reports she is doing well. No acute events overnight. She denies any problems with ambulating, voiding or po intake. Denies nausea or vomiting.  Pain is well controlled.  Lochia is mild.  Objective: Blood pressure 96/63, pulse 61, temperature 97.8 F (36.6 C), temperature source Oral, resp. rate 17, height 5\' 5"  (1.651 m), weight 59.9 kg, last menstrual period 10/31/2018, SpO2 100 %, unknown if currently breastfeeding.  Physical Exam:  General: alert, cooperative and no distress Chest: no respiratory distress Heart:regular rate, distal pulses intact Abdomen: soft, nontender Uterine Fundus: firm, appropriately tender DVT Evaluation: No calf swelling or tenderness Extremities: no edema Skin: warm, dry  Recent Labs    08/05/19 1130  HGB 12.3  HCT 36.8    Assessment/Plan: Kamea Dacosta is a 29 y.o. G3P3003 on PPD#1 s/p SVD w/ IOL d/t PROM at [redacted]w[redacted]d.   PPD# 1 - Doing well. Continue routine postpartum care.  Contraception: Depo  Feeding: Bottle Dispo: Plan for discharge tomorrow.   LOS: 1 day    [redacted]w[redacted]d, DO, PGY-1 OBGYN Faculty Teaching Service  08/06/2019, 9:47 AM   I was present for the exam and agree with above. Reports still having significant cramping after  IBU. Uterus NT. No fever or Sx of infection. Lochia small. Likely due to multiparity. Will change to Toradol, encourage heat. May have Oxy sparingly. Still plans IP circ. Encouraged to pay ASAP to avoid delaying D/C.   10/04/2019, Katrinka Blazing, CNM 08/06/2019 10:50 AM

## 2019-08-07 MED ORDER — OXYCODONE HCL 5 MG PO TABS: 5.0000 mg | ORAL_TABLET | ORAL | 0 refills | Status: AC | PRN

## 2019-08-07 MED ORDER — ACETAMINOPHEN 325 MG PO TABS: 650.0000 mg | ORAL_TABLET | ORAL | 0 refills | Status: DC | PRN

## 2019-08-07 NOTE — Plan of Care (Signed)
  Problem: Life Cycle: Goal: Ability to effectively push during vaginal delivery will improve Outcome: Completed/Met   Problem: Role Relationship: Goal: Will demonstrate positive interactions with the child Outcome: Completed/Met   Problem: Safety: Goal: Risk of complications during labor and delivery will decrease Outcome: Completed/Met   Problem: Pain Management: Goal: Relief or control of pain from uterine contractions will improve Outcome: Completed/Met   Problem: Clinical Measurements: Goal: Ability to maintain clinical measurements within normal limits will improve Outcome: Completed/Met Goal: Will remain free from infection Outcome: Completed/Met Goal: Diagnostic test results will improve Outcome: Completed/Met Goal: Respiratory complications will improve Outcome: Completed/Met Goal: Cardiovascular complication will be avoided Outcome: Completed/Met   Problem: Activity: Goal: Risk for activity intolerance will decrease Outcome: Completed/Met   Problem: Nutrition: Goal: Adequate nutrition will be maintained Outcome: Completed/Met   Problem: Elimination: Goal: Will not experience complications related to bowel motility Outcome: Completed/Met   Problem: Pain Managment: Goal: General experience of comfort will improve Outcome: Completed/Met   Problem: Safety: Goal: Ability to remain free from injury will improve Outcome: Completed/Met   Problem: Education: Goal: Knowledge of condition will improve Outcome: Completed/Met   Problem: Activity: Goal: Will verbalize the importance of balancing activity with adequate rest periods Outcome: Completed/Met   Problem: Role Relationship: Goal: Ability to demonstrate positive interaction with newborn will improve Outcome: Completed/Met

## 2019-08-28 ENCOUNTER — Emergency Department (HOSPITAL_COMMUNITY): Payer: Medicaid Other

## 2019-08-28 ENCOUNTER — Other Ambulatory Visit: Payer: Self-pay

## 2019-08-28 ENCOUNTER — Encounter (HOSPITAL_COMMUNITY): Payer: Self-pay | Admitting: Emergency Medicine

## 2019-08-28 ENCOUNTER — Emergency Department (HOSPITAL_COMMUNITY)
Admission: EM | Admit: 2019-08-28 | Discharge: 2019-08-29 | Disposition: A | Discharge status: Home / Self Care | Payer: Medicaid Other | Attending: Emergency Medicine | Admitting: Emergency Medicine

## 2019-08-28 DIAGNOSIS — R002 Palpitations: Secondary | ICD-10-CM | POA: Insufficient documentation

## 2019-08-28 DIAGNOSIS — Z79899 Other long term (current) drug therapy: Secondary | ICD-10-CM | POA: Diagnosis not present

## 2019-08-28 DIAGNOSIS — F17210 Nicotine dependence, cigarettes, uncomplicated: Secondary | ICD-10-CM | POA: Diagnosis not present

## 2019-08-28 LAB — COMPREHENSIVE METABOLIC PANEL
ALT: 18 U/L (ref 0–44)
AST: 15 U/L (ref 15–41)
Albumin: 3.8 g/dL (ref 3.5–5.0)
Alkaline Phosphatase: 76 U/L (ref 38–126)
Anion gap: 9 (ref 5–15)
BUN: 15 mg/dL (ref 6–20)
CO2: 17 mmol/L — ABNORMAL LOW (ref 22–32)
Calcium: 9.4 mg/dL (ref 8.9–10.3)
Chloride: 113 mmol/L — ABNORMAL HIGH (ref 98–111)
Creatinine, Ser: 0.95 mg/dL (ref 0.44–1.00)
GFR calc Af Amer: >60 mL/min (ref >60)
GFR calc non Af Amer: >60 mL/min (ref >60)
Glucose, Bld: 104 mg/dL — ABNORMAL HIGH (ref 70–99)
Potassium: 3.6 mmol/L (ref 3.5–5.1)
Sodium: 139 mmol/L (ref 135–145)
Total Bilirubin: 0.7 mg/dL (ref 0.3–1.2)
Total Protein: 7.2 g/dL (ref 6.5–8.1)

## 2019-08-28 LAB — CBC WITH DIFFERENTIAL/PLATELET
Abs Immature Granulocytes: 0.03 10*3/uL (ref 0.00–0.07)
Basophils Absolute: 0.1 10*3/uL (ref 0.0–0.1)
Basophils Relative: 1 %
Eosinophils Absolute: 0.2 10*3/uL (ref 0.0–0.5)
Eosinophils Relative: 2 %
HCT: 43.8 % (ref 36.0–46.0)
Hemoglobin: 14.7 g/dL (ref 12.0–15.0)
Immature Granulocytes: 0 %
Lymphocytes Relative: 37 %
Lymphs Abs: 3.8 10*3/uL (ref 0.7–4.0)
MCH: 32.7 pg (ref 26.0–34.0)
MCHC: 33.6 g/dL (ref 30.0–36.0)
MCV: 97.3 fL (ref 80.0–100.0)
Monocytes Absolute: 0.6 10*3/uL (ref 0.1–1.0)
Monocytes Relative: 6 %
Neutro Abs: 5.7 10*3/uL (ref 1.7–7.7)
Neutrophils Relative %: 54 %
Platelets: 326 10*3/uL (ref 150–400)
RBC: 4.5 MIL/uL (ref 3.87–5.11)
RDW: 12.6 % (ref 11.5–15.5)
WBC: 10.3 10*3/uL (ref 4.0–10.5)
nRBC: 0 % (ref 0.0–0.2)

## 2019-08-28 LAB — I-STAT BETA HCG BLOOD, ED (MC, WL, AP ONLY): I-stat hCG, quantitative: <5 m[IU]/mL (ref <5)

## 2019-08-28 LAB — TROPONIN I (HIGH SENSITIVITY): Troponin I (High Sensitivity): <2 ng/L (ref <18)

## 2019-08-28 NOTE — ED Triage Notes (Signed)
Patient reports intermittent palpitations with fatigue and lightheaded onset this week .

## 2019-08-28 NOTE — ED Provider Notes (Signed)
[facility] EMERGENCY DEPARTMENT Provider Note   CSN: [id number] Arrival date & time: 08/28/19  2123     History Chief Complaint  Patient presents with  . Palpitations    Amy Decker is a 29 y.o. female.  Patient presents to the ED with a chief complaint of palpitations.  She states that she has been having symptoms for the past week or so.  She states that she has been fairly anxious and stressed.  She states she is almost 1 month post-partum.  She states that she feels fine now.  Denies any treatments prior to arrival.  She denies any chest pain or leg swelling.  She states that she was told that her HGB was low and wanted to have this checked.  The history is provided by the patient. No language interpreter was used.       Past Medical History:  Diagnosis Date  . Peripheral vascular disease [facility])     Patient Active Problem List   Diagnosis Date Noted  . Genetic testing 08/06/2019  . Premature rupture of membranes 08/05/2019  . Marginal insertion of umbilical cord affecting management of mother 05/07/2019  . Late prenatal care affecting pregnancy 04/06/2019  . Supervision of other normal pregnancy, antepartum 03/30/2019    Past Surgical History:  Procedure Laterality Date  . CHOLECYSTECTOMY    . TONSILLECTOMY       OB History    Gravida  3   Para  3   Term  3   Preterm      AB      Living  3     SAB      TAB      Ectopic      Multiple  0   Live Births  3           Family History  Problem Relation Age of Onset  . Arthritis Mother   . Cancer Mother   . Hypertension Maternal Grandfather     Social History   Tobacco Use  . Smoking status: Current Every Day Smoker    Types: Cigarettes  . Smokeless tobacco: Never Used  Substance Use Topics  . Alcohol use: Never  . Drug use: Never    Home Medications Prior to Admission medications   Medication Sig Start Date End Date Taking? Authorizing Provider   acetaminophen (TYLENOL) 325 MG tablet Take 2 tablets (650 mg total) by mouth every 4 (four) hours as needed (for pain scale < 4). 08/07/19   Gladys Damme, MD  Blood Pressure Monitoring (BLOOD PRESSURE KIT) DEVI 1 kit by Does not apply route once a week. Check BP regularly. Large Cuff 03/30/19   Shelly Bombard, MD  omeprazole (PRILOSEC) 20 MG capsule Take 20 mg by mouth daily.    [provider]  Prenatal Vit-Fe Fumarate-FA (PRENATAL MULTIVITAMIN) TABS tablet Take 1 tablet by mouth daily at 12 noon.    [provider]    Allergies    Patient has no known allergies.  Review of Systems   Review of Systems  All other systems reviewed and are negative.   Physical Exam Updated Vital Signs BP 122/76   Pulse 90   Temp 97.7 F (36.5 C) (Oral)   Resp 15   Ht 5' 5"  (1.651 m)   Wt 55 kg   LMP 10/31/2018 (Approximate)   SpO2 100%   BMI 20.18 kg/m   Physical Exam Vitals and nursing note reviewed.  Constitutional:  General: She is not in acute distress.    Appearance: She is well-developed.  HENT:     Head: Normocephalic and atraumatic.  Eyes:     Conjunctiva/sclera: Conjunctivae normal.  Cardiovascular:     Rate and Rhythm: Normal rate and regular rhythm.     Pulses: Normal pulses.     Heart sounds: No murmur.  Pulmonary:     Effort: Pulmonary effort is normal. No respiratory distress.     Breath sounds: Normal breath sounds.  Abdominal:     Palpations: Abdomen is soft.     Tenderness: There is no abdominal tenderness.  Musculoskeletal:     Cervical back: Neck supple.  Skin:    General: Skin is warm and dry.  Neurological:     Mental Status: She is alert and oriented to person, place, and time.  Psychiatric:        Mood and Affect: Mood normal.        Behavior: Behavior normal.     ED Results / Procedures / Treatments   Labs (all labs ordered are listed, but only abnormal results are displayed) Labs Reviewed  COMPREHENSIVE METABOLIC PANEL -  Abnormal; Notable for the following components:      Result Value   Chloride 113 (*)    CO2 17 (*)    Glucose, Bld 104 (*)    All other components within normal limits  CBC WITH DIFFERENTIAL/PLATELET  I-STAT BETA HCG BLOOD, ED ([facility], [facility], [facility] ONLY)  TROPONIN I (HIGH SENSITIVITY)  TROPONIN I (HIGH SENSITIVITY)    EKG None  Radiology DG Chest 2 View  Result Date: 08/28/2019 CLINICAL DATA:  Palpitations, short of breath EXAM: CHEST - 2 VIEW COMPARISON:  07/25/2018 FINDINGS: The heart size and mediastinal contours are within normal limits. Both lungs are clear. The visualized skeletal structures are unremarkable. IMPRESSION: No active cardiopulmonary disease. Electronically Signed   By: Randa Ngo M.D.   On: 08/28/2019 22:01    Procedures Procedures (including critical care time)  Medications Ordered in ED Medications - No data to display  ED Course  I have reviewed the triage vital signs and the nursing notes.  Pertinent labs & imaging results that were available during my care of the patient were reviewed by me and considered in my medical decision making (see chart for details).    MDM Rules/Calculators/A&P                      Patient with palpitations.  EKG shows NSR.  HR is normal.  HGB is normal.    Patient is well appearing and in no acute distress.  She denies having any symptoms now.   We discussed follow-up with PCP.  May benefit from heart monitor and further assessment by PCP, but at this time symptoms are thought to be anxiety related.  No clear emergent cause identified in the ED.   Final Clinical Impression(s) / ED Diagnoses Final diagnoses:  Palpitations    Rx / DC Orders ED Discharge Orders    None       Montine Circle, PA-C 08/29/19 6599    Tegeler, Gwenyth Allegra, MD 08/29/19 [phone]

## 2019-08-28 NOTE — Discharge Instructions (Signed)
Your tests in the emergency department reveal no clear cause of your symptoms.  The palpitations could be anxiety/stress related. You may need to also have additional tests performed by your doctor such as a thyroid panel and possibly wearing a heart monitor for a few days.  Please return for new or worsening symptoms.

## 2019-08-30 ENCOUNTER — Ambulatory Visit (INDEPENDENT_AMBULATORY_CARE_PROVIDER_SITE_OTHER)
Admission: RE | Admit: 2019-08-30 | Discharge: 2019-08-30 | Disposition: A | Discharge status: Home / Self Care | Payer: Medicaid Other | Source: Ambulatory Visit

## 2019-08-30 ENCOUNTER — Other Ambulatory Visit: Payer: Self-pay | Admitting: Obstetrics and Gynecology

## 2019-08-30 DIAGNOSIS — F17200 Nicotine dependence, unspecified, uncomplicated: Secondary | ICD-10-CM

## 2019-08-30 DIAGNOSIS — R002 Palpitations: Secondary | ICD-10-CM | POA: Diagnosis not present

## 2019-08-30 DIAGNOSIS — F32A Depression, unspecified: Secondary | ICD-10-CM

## 2019-08-30 DIAGNOSIS — R0602 Shortness of breath: Secondary | ICD-10-CM | POA: Diagnosis not present

## 2019-08-30 DIAGNOSIS — F329 Major depressive disorder, single episode, unspecified: Secondary | ICD-10-CM

## 2019-08-30 DIAGNOSIS — F53 Postpartum depression: Secondary | ICD-10-CM

## 2019-08-30 MED ORDER — FAMOTIDINE 20 MG PO TABS: 20.0000 mg | ORAL_TABLET | Freq: Two times a day (BID) | ORAL | 0 refills | Status: DC

## 2019-08-30 MED ORDER — ALBUTEROL SULFATE HFA 108 (90 BASE) MCG/ACT IN AERS: 1.0000 | INHALATION_SPRAY | Freq: Four times a day (QID) | RESPIRATORY_TRACT | 0 refills | Status: DC | PRN

## 2019-08-30 NOTE — ED Provider Notes (Signed)
Virtual Visit via Video Note:  Amy Decker  initiated request for Telemedicine visit with [facility] Urgent Care team. I connected with Amy Decker  on 08/30/2019 at 5:27 PM  for a synchronized telemedicine visit using a video enabled HIPPA compliant telemedicine application. I verified that I am speaking with Amy Decker  using two identifiers. Jsiah Menta Doree Fudge, PA-C  was physically located in a [facility] Urgent care site and Faria Casella was located at a different location.   The limitations of evaluation and management by telemedicine as well as the availability of in-person appointments were discussed. Patient was informed that she  may incur a bill ( including co-pay) for this virtual visit encounter. Amy Decker  expressed understanding and gave verbal consent to proceed with virtual visit.     History of Present Illness:Amy Decker  is a 29 y.o. female presents with questions about EKG done during ED visit 2 days ago.  She went to the emergency department due to palpitations, shortness of breath, and states was told work-up was normal and to follow-up with PCP for further evaluation.  While reviewing chart, noticed that EKG mentioned atrial enlargement as well as possible infarct, and therefore made this appointment.  Her shortness of breath is intermittent, not specifically due to exertion.  She feels palpitations and hot flashes during the symptoms, and wonders if anxiety can cause symptoms.  She denies any URI symptoms such as cough, congestion, sore throat.  Denies fever, chills, body aches.  She is an every day smoker.  Has history of acid reflux, occasionally has flareup with burning sensation to the chest.  Currently on omeprazole daily.  Patient with recent delivery, not currently breast-feeding. Had negative COVID testing at the time, and has had limited contact since then.  During ED visit, CBC, CMP, troponin were normal.  Vitals stable  without hypoxia, tachypnea, tachycardia.  Past Medical History:  Diagnosis Date  . Peripheral vascular disease ([facility])     No Known Allergies      Observations/Objective: General: Well appearing, nontoxic, no acute distress. Sitting comfortably. Head: Normocephalic, atraumatic Eye: No conjunctival injection, eyelid swelling. EOMI ENT: Mucus membranes moist, no lip cracking. No obvious nasal drainage. Pulm: Speaking in full sentences without difficulty. Normal effort. No respiratory distress, accessory muscle use. Neuro: Normal mental status. Alert and oriented x 3.  Assessment and Plan: Reviewed work-up at the emergency department and results. No alarming signs. Given smoking history, discussed can try albuterol, though may increase palpitations. Patient is agreeable to try. Will also add pepcid to see if acid reflux could be causing symptoms. Otherwise, patient to follow up with PCP for further workup of palpitations/shortness of breath. Provided information for [facility]. Return precautions given.  Follow Up Instructions:    I discussed the assessment and treatment plan with the patient. The patient was provided an opportunity to ask questions and all were answered. The patient agreed with the plan and demonstrated an understanding of the instructions.   The patient was advised to call back or seek an in-person evaluation if the symptoms worsen or if the condition fails to improve as anticipated.  I provided 20 minutes of non-face-to-face time during this encounter.    Belinda Fisher, PA-C  08/30/2019 5:27 PM         Belinda Fisher, PA-C 08/30/19 1759

## 2019-08-30 NOTE — Discharge Instructions (Addendum)
No alarming signs. We will try a few things. You can try albuterol to see if this if due to asthma/bronchitis given smoking history. This may make you jittery, and it may make palpitations worse, but can see if it helps. Can add second acid reflux medicine (pepcid) to see if acid reflux is causing symptoms. Otherwise, may also have to consider repeating COVID testing, especially if you start having fever, cough. Please follow up with PCP for further evaluation needed. If sudden worsening of shortness of breath, chest pain, passing out, go to the emergency department for further evaluation.  I also added resource guide. The title is for substance abuse/counseling, but it shows different behavioral health clinics that takes medicaid. Many times, they will also provided services for therapist/anxiety control.   [facility] Urgent Care at [facility]) [address] [place], [place], [place] [address] [phone]  [facility] Health Urgent Care at [facility] [address] [place], [place] [address] [phone]  [facility] Urgent Care at [facility] [address] [place], [place] [address] [phone]  [facility] at [facility] [address], [place], [place] [address] [phone]  [facility] Urgent Care at [facility] [address] [place], [place] [address] [phone]  [facility] Urgent Care at [facility] [address], [place], [place] [address] [phone]

## 2019-08-31 ENCOUNTER — Ambulatory Visit (INDEPENDENT_AMBULATORY_CARE_PROVIDER_SITE_OTHER): Payer: Medicaid Other | Admitting: Licensed Clinical Social Worker

## 2019-08-31 DIAGNOSIS — F411 Generalized anxiety disorder: Secondary | ICD-10-CM | POA: Diagnosis not present

## 2019-09-01 ENCOUNTER — Telehealth: Payer: Medicaid Other | Admitting: Family

## 2019-09-01 ENCOUNTER — Encounter: Payer: Self-pay | Admitting: Certified Nurse Midwife

## 2019-09-01 DIAGNOSIS — M5441 Lumbago with sciatica, right side: Secondary | ICD-10-CM

## 2019-09-01 MED ORDER — BACLOFEN 10 MG PO TABS: 10.0000 mg | ORAL_TABLET | Freq: Three times a day (TID) | ORAL | 0 refills | Status: DC

## 2019-09-01 MED ORDER — NAPROXEN 500 MG PO TABS: 500.0000 mg | ORAL_TABLET | Freq: Two times a day (BID) | ORAL | 0 refills | Status: DC

## 2019-09-01 NOTE — Progress Notes (Signed)

## 2019-09-01 NOTE — BH Specialist Note (Signed)
Integrated Behavioral Health Initial Visit  MRN: [id number] Name: Amy Decker  Number of Integrated Behavioral Health Clinician visits:: 1 Session Start time: 11:00am  Session End time: 11:22am Total time: 22 mins   Type of Service: Integrated Behavioral Health- [place]ivid[place]  Interpretor:no  Interpretor Name and Language: none    Warm Hand Off Completed.       SUBJECTIVE: Amy Decker is a 29 y.o. female accompanied by  Patient was referred by P. Constant MD  for anxiety. Patient reports the following symptoms/concerns: excessive worry, isolation and irritability  Duration of problem: Off and on for years ; Severity of problem: moderate   OBJECTIVE: Mood: good  and Affect: normal  Risk of harm to self or others: No risk of harm to self or others   LIFE CONTEXT: Family and Social: resides in [place] with children  School/Work: n/a Self-Care: demonstrate deep breathing skills, effective sleep routine  Life Changes: adjusting to newborn   GOALS ADDRESSED: Patient will: 1. Reduce symptoms of: anxiety  2. Increase knowledge and/or ability of: recognize triggering symptoms   3. Demonstrate ability to: self manages copings to alleviate symptoms   INTERVENTIONS: Interventions utilized: brief supportive counseling   Standardized Assessments completed: will complete screening on 09/06/2019  ASSESSMENT: Patient currently experiencing general anxiety disorder    Patient may benefit from medication management and integrated behavioral health   PLAN: 1. Follow up with behavioral health clinician on : 09/06/2019 2. Behavioral recommendations: PP Visit 09/06/2019  3. Referral(s): Provider for possible medication management 4. "From scale of 1-10, how likely are you to follow plan?":   Gwyndolyn Saxon, LCSW

## 2019-09-03 ENCOUNTER — Encounter: Payer: Self-pay | Admitting: Certified Nurse Midwife

## 2019-09-06 ENCOUNTER — Encounter: Payer: Self-pay | Admitting: Certified Nurse Midwife

## 2019-09-06 ENCOUNTER — Ambulatory Visit (INDEPENDENT_AMBULATORY_CARE_PROVIDER_SITE_OTHER): Payer: Medicaid Other | Admitting: Licensed Clinical Social Worker

## 2019-09-06 ENCOUNTER — Telehealth (INDEPENDENT_AMBULATORY_CARE_PROVIDER_SITE_OTHER): Payer: Medicaid Other | Admitting: Certified Nurse Midwife

## 2019-09-06 DIAGNOSIS — O99345 Other mental disorders complicating the puerperium: Secondary | ICD-10-CM

## 2019-09-06 DIAGNOSIS — F418 Other specified anxiety disorders: Secondary | ICD-10-CM | POA: Diagnosis not present

## 2019-09-06 DIAGNOSIS — F411 Generalized anxiety disorder: Secondary | ICD-10-CM

## 2019-09-06 MED ORDER — ESCITALOPRAM OXALATE 10 MG PO TABS: 10.0000 mg | ORAL_TABLET | Freq: Every day | ORAL | 1 refills | Status: DC

## 2019-09-06 NOTE — BH Specialist Note (Signed)
Integrated Behavioral Health Initial Visit  MRN: [id number] Name: Amy Decker  Number of Integrated Behavioral Health Clinician visits:: 1 Session Start time: 10:30am  Session End time: 10:47am Total time: 17 mins   Type of Service: Integrated Behavioral Health- Individual Interpretor:no  Interpretor Name and Language: None   Warm Hand Off Completed.       SUBJECTIVE: Amy Decker is a 29 y.o. female accompanied by  Patient was referred by P.Constant MD for integrated behavioral health  Patient reports the following symptoms/concerns: sleep issues, worry, anxious, overwhelmed  Duration of problem: current issue approx 4 weeks however pt reports anxiety symptoms is off and on for years ; Severity of problem: moderate  OBJECTIVE: Mood: Good  and Affect: normal  Risk of harm to self or others: no risk of harm to self or others   LIFE CONTEXT: Family and Social: resides in [place] with children School/Work: n/a Self-Care: n/a Life Changes: adjusting to newborn  GOALS ADDRESSED: Patient will: 1. Reduce symptoms of: General anxiety disorder  2. Increase knowledge and/or ability of:  Identify triggers associated with symptoms   3. Demonstrate ability to: self manage symptoms with medication management, deep breathing exercises and effective sleep pattern   INTERVENTIONS: Interventions utilized: brief supportive   Standardized Assessments completed: PHQ9 score 8  ASSESSMENT: Patient currently experiencing general anxiety disorder    Patient may benefit from medication management and integrated behavioral health    PLAN: 1. Follow up with behavioral health clinician on : 4 weeks  2. Behavioral recommendations: demonstrate techniques discuss  3. Referral(s): none  4. "From scale of 1-10, how likely are you to follow plan?":   Gwyndolyn Saxon, LCSW

## 2019-09-06 NOTE — Progress Notes (Signed)
TELEHEALTH POSTPARTUM VIRTUAL VIDEO VISIT ENCOUNTER NOTE   Provider location: [facility] for [facility] at [place]   I connected with Amy Decker on 09/06/19 at 10:15 AM EST by MyChart Video Encounter at home and verified that I am speaking with the correct person using two identifiers.    I discussed the limitations, risks, security and privacy concerns of performing an evaluation and management service virtually and the availability of in person appointments. I also discussed with the patient that there may be a patient responsible charge related to this service. The patient expressed understanding and agreed to proceed.  Chief Complaint: Postpartum Visit  History of Present Illness: Amy Decker is a 29 y.o. Caucasian [id number] being evaluated for postpartum followup.    She is s/p normal spontaneous vaginal delivery on 08/05/19 at 36w5dweeks; she was discharged to home on 2/6/21D#2. Pregnancy complicated by marginal cord insertion and late prenatal care. Baby is doing well.  Complains of worsening anxiety   Vaginal bleeding or discharge: Yes -thin lochia  Intercourse: No  Contraception: Depo-Provera Mode of feeding infant: Bottle- Gerber good start PP depression s/s: Yes .  Any bowel or bladder issues: No  Pap smear: no abnormalities (date: 04/06/19)    Review of Systems: Positive for anxiety.  Patient Active Problem List   Diagnosis Date Noted  . Genetic testing 08/06/2019  . Premature rupture of membranes 08/05/2019  . Marginal insertion of umbilical cord affecting management of mother 05/07/2019  . Late prenatal care affecting pregnancy 04/06/2019  . Supervision of other normal pregnancy, antepartum 03/30/2019    Medications VEritreaL. GMessingerhad no medications administered during this visit. Current Outpatient Medications  Medication Sig Dispense Refill  . acetaminophen (TYLENOL) 325 MG tablet Take 2 tablets (650 mg total) by mouth every 4  (four) hours as needed (for pain scale < 4). 30 tablet 0  . albuterol (VENTOLIN HFA) 108 (90 Base) MCG/ACT inhaler Inhale 1-2 puffs into the lungs every 6 (six) hours as needed for wheezing or shortness of breath. 8 g 0  . baclofen (LIORESAL) 10 MG tablet Take 1 tablet (10 mg total) by mouth 3 (three) times daily. 30 each 0  . Blood Pressure Monitoring (BLOOD PRESSURE KIT) DEVI 1 kit by Does not apply route once a week. Check BP regularly. Large Cuff 1 kit 0  . famotidine (PEPCID) 20 MG tablet Take 1 tablet (20 mg total) by mouth 2 (two) times daily. 30 tablet 0  . naproxen (NAPROSYN) 500 MG tablet Take 1 tablet (500 mg total) by mouth 2 (two) times daily with a meal. 30 tablet 0  . omeprazole (PRILOSEC) 20 MG capsule Take 20 mg by mouth daily.    . Prenatal Vit-Fe Fumarate-FA (PRENATAL MULTIVITAMIN) TABS tablet Take 1 tablet by mouth daily at 12 noon.     No current facility-administered medications for this visit.    Allergies Patient has no known allergies.  Physical Exam:  LMP 10/31/2018 (Approximate)  General:  Alert, oriented and cooperative. Patient is in no acute distress.  Mental Status: Normal mood and affect. Normal behavior. Normal judgment and thought content.   Respiratory: Normal respiratory effort noted, no problems with respiration noted  Rest of physical exam deferred due to type of encounter  PP Depression Screening:   Edinburgh Postnatal Depression Scale Screening Tool 09/06/2019 08/06/2019  I have been able to laugh and see the funny side of things. 0 0  I have looked forward with enjoyment to  things. 1 0  I have blamed myself unnecessarily when things went wrong. 0 0  I have been anxious or worried for no good reason. 0 0  I have felt scared or panicky for no good reason. 3 0  Things have been getting on top of me. 0 0  I have been so unhappy that I have had difficulty sleeping. 0 0  I have felt sad or miserable. 0 0  I have been so unhappy that I have been crying. 1  0  The thought of harming myself has occurred to me. 0 0  Edinburgh Postnatal Depression Scale Total 5 0     Assessment:Patient is a 29 y.o. [name] who is 4 weeks postpartum from a normal spontaneous vaginal delivery.  She is doing well.   Plan: 1. Postpartum care and examination - Routine postpartum care, patient reports worsening anxiety - has been seen by integrated behavioral health and medication is recommended  - Discussed with patient plan of care and will clear her to return to work after mood check in 2 weeks from today, patient verbalizes understanding and agrees to plan of care  2. Generalized anxiety disorder - Patient currently formula feeding, denies current medications or allergies - Educated and discussed r/b and side effects of medication  - escitalopram (LEXAPRO) 10 MG tablet; Take 1 tablet (10 mg total) by mouth daily.  Dispense: 30 tablet; Refill: 1  I discussed the assessment and treatment plan with the patient. The patient was provided an opportunity to ask questions and all were answered. The patient agreed with the plan and demonstrated an understanding of the instructions.   The patient was advised to call back or seek an in-person evaluation/go to the ED for any concerning postpartum symptoms.  I provided 15 minutes of face-to-face time during this encounter.   Lajean Manes, [place] for [facility], [place]

## 2019-09-07 ENCOUNTER — Telehealth: Payer: Medicaid Other | Admitting: Physician Assistant

## 2019-09-07 DIAGNOSIS — N390 Urinary tract infection, site not specified: Secondary | ICD-10-CM

## 2019-09-07 MED ORDER — CEPHALEXIN 500 MG PO CAPS: ORAL_CAPSULE | ORAL | 0 refills | Status: DC

## 2019-09-07 NOTE — Progress Notes (Addendum)
  We are sorry that you are not feeling well.  Here is how we plan to help!  Based on what you shared with me it looks like you most likely have a urinary tract infection and may have a kidney infection.  When I reviewed your visit from several days ago, it did not look like you were having urinary symptoms at that time.  I think it is reasonable to start an antibiotic.  If your symptoms worsen in any way, your fever persists, you begin vomiting or your pain worsens you will need a face-to face evaluation at an urgent care or in the ED.  A UTI (Urinary Tract Infection) is a bacterial infection of the bladder.  Most cases of urinary tract infections are simple to treat but a key part of your care is to encourage you to drink plenty of fluids and watch your symptoms carefully.  I have prescribed Keflex 500 mg 4x per day for 7 days.  Your symptoms should gradually improve. Call us if the burning in your urine worsens, you develop worsening fever, back pain or pelvic pain or if your symptoms do not resolve after completing the antibiotic.  Urinary tract infections can be prevented by drinking plenty of water to keep your body hydrated.  Also be sure when you wipe, wipe from front to back and don't hold it in!  If possible, empty your bladder every 4 hours.  Your e-visit answers were reviewed by a board certified advanced clinical practitioner to complete your personal care plan.  Depending on the condition, your plan could have included both over the counter or prescription medications.  If there is a problem please reply  once you have received a response from your provider.  Your safety is important to [place].  If you have drug allergies check your prescription carefully.    You can use MyChart to ask questions about today's visit, request a non-urgent call back, or ask for a work or school excuse for 24 hours related to this e-Visit. If it has been greater than 24 hours you will need to follow up with  your provider, or enter a new e-Visit to address those concerns.   You will get an e-mail in the next two days asking about your experience.  I hope that your e-visit has been valuable and will speed your recovery. Thank you for using e-visits.  Greater than 5 minutes, yet less than 10 minutes of time have been spent researching, coordinating, and implementing care for this patient today

## 2019-09-08 ENCOUNTER — Encounter: Payer: Self-pay | Admitting: Certified Nurse Midwife

## 2019-09-08 ENCOUNTER — Telehealth: Payer: Medicaid Other | Admitting: Nurse Practitioner

## 2019-09-08 DIAGNOSIS — M5441 Lumbago with sciatica, right side: Secondary | ICD-10-CM

## 2019-09-08 NOTE — Progress Notes (Signed)
Based on what you shared with me it looks like you have back pain,that should be evaluated in a face to face office visit. According to your chart, you did an evisit on 09/01/19 with back pain. You were given naprosyn and baclofen. If this is not helping you will need a face to face visit for further treatment.     NOTE: If you entered your credit card information for this eVisit, you will not be charged. You may see a "hold" on your card for the $35 but that hold will drop off and you will not have a charge processed.  If you are having a true medical emergency please call 911.     For an urgent face to face visit, [place] has four urgent care centers for your convenience:   . [facility]    [phone]                  Get Driving Directions  [address] [place], [place] [address] . 10 am to 8 pm Monday-Friday . 12 pm to 8 pm Saturday-Sunday   . [facility] at [facility]  [phone]                  Get Driving Directions  [address], [address] [place], [place] [address] . 8 am to 8 pm Monday-Friday . 9 am to 6 pm Saturday . 11 am to 6 pm Sunday   . [facility] at [facility]  [phone]                  Get Driving Directions   [address] [place], [place] [address] . 8 am to 8 pm Monday-Friday . 8 am to 4 pm Saturday-Sunday    . [facility] at [facility] Directions  [phone]  [address], [address] [place], [place] [address]  . Monday-Friday, 12 PM to 6 PM    Your e-visit answers were reviewed by a board certified advanced clinical practitioner to complete your personal care plan.  Thank you for using e-Visits.

## 2019-09-15 ENCOUNTER — Telehealth: Payer: Self-pay | Admitting: *Deleted

## 2019-09-15 ENCOUNTER — Ambulatory Visit: Payer: Medicaid Other

## 2019-09-15 NOTE — Telephone Encounter (Signed)
Call to patient to ask about missed appointment.  Message left for patient to call the Clinics to reschedule.  Angelina Ok, RN 09/15/2019 2:07 PM

## 2019-09-21 ENCOUNTER — Telehealth (INDEPENDENT_AMBULATORY_CARE_PROVIDER_SITE_OTHER): Payer: Medicaid Other | Admitting: Certified Nurse Midwife

## 2019-09-21 ENCOUNTER — Encounter: Payer: Self-pay | Admitting: Certified Nurse Midwife

## 2019-09-21 ENCOUNTER — Ambulatory Visit (INDEPENDENT_AMBULATORY_CARE_PROVIDER_SITE_OTHER): Payer: Medicaid Other | Admitting: Licensed Clinical Social Worker

## 2019-09-21 DIAGNOSIS — O99345 Other mental disorders complicating the puerperium: Secondary | ICD-10-CM

## 2019-09-21 DIAGNOSIS — F418 Other specified anxiety disorders: Secondary | ICD-10-CM

## 2019-09-21 DIAGNOSIS — F411 Generalized anxiety disorder: Secondary | ICD-10-CM

## 2019-09-21 NOTE — BH Specialist Note (Signed)
Integrated Behavioral Health Follow Up Visit  MRN: [id number] Name: Amy Decker  Number of Integrated Behavioral Health Clinician visits: 3 Session Start time: 10:00am   Session End time: 10:11am Total time: 11 mins  Type of Service: Integrated Behavioral Health- Individual Interpretor:no  Interpretor Name and Language: none   SUBJECTIVE: Amy Decker is a 29 y.o. female  Patient was referred by Chana Bode MD for postpartum depression  Patient reports the following symptoms/concerns: depression  Duration of problem: approx 6 weeks  ; Severity of problem: moderate   OBJECTIVE: Mood: good  and Affect: congruent  Risk of harm to self or others: no risk of harm to self or others.   LIFE CONTEXT: Family and Social: resides in [place] with children  School/Work:  Self-Care:  Life Changes: adjusting to newborn   GOALS ADDRESSED: Patient will: 1.  Reduce symptoms of:  anxiety 2.  Increase knowledge and/or ability of:   3.  Demonstrate ability to: self manage symptoms   INTERVENTIONS: Interventions utilized:  Support counseling  Standardized Assessments completed: completed 09/06/2019  ASSESSMENT: Patient reports symptoms and sleep has improved. Patient appears very pleasant during today's visit. Patient reports increased support and effectively managing symptoms.    Patient may benefit from integrated behavioral health   PLAN: 1. Follow up with behavioral health clinician on : as needed  2. Behavioral recommendations: continue with prescribed medication, follow up with primary care physician regarding refills. Prioritize with sleep  3. Referral(s): none  4. "From scale of 1-10, how likely are you to follow plan?":   Gwyndolyn Saxon, LCSW

## 2019-09-21 NOTE — Progress Notes (Signed)
S/p SVD 08/05/19 at [redacted]w[redacted]d F/u pp depression EPDS = 2 - pt feeling "a lot better"

## 2019-09-21 NOTE — Progress Notes (Signed)
POSTPARTUM VIRTUAL VISIT ENCOUNTER NOTE  Provider location: [facility] at [place]   I connected with [name] on 09/21/19 at 11:05 AM EDT by MyChart Video Encounter at home and verified that I am speaking with the correct person using two identifiers.    I discussed the limitations, risks, security and privacy concerns of performing an evaluation and management service virtually and the availability of in person appointments. I also discussed with the patient that there may be a patient responsible charge related to this service. The patient expressed understanding and agreed to proceed.  Chief Complaint: Mood Check   Appointment Date: 09/21/2019   History of Present Illness: Sarahi Borland is a 29 y.o. Caucasian [id number] She is s/p normal spontaneous vaginal delivery on 08/05/19. She is currently on Depo for birth control.   Patient was started on Lexapro for anxiety at time of postpartum appointment. Patient reports feeling a lot better, is sleeping normally and reports a balance in hormones.   Review of Systems: Her 12 point review of systems is negative or as noted in the History of Present Illness.  Patient Active Problem List   Diagnosis Date Noted  . Genetic testing 08/06/2019  . Premature rupture of membranes 08/05/2019  . Marginal insertion of umbilical cord affecting management of mother 05/07/2019  . Late prenatal care affecting pregnancy 04/06/2019  . Supervision of other normal pregnancy, antepartum 03/30/2019    Medications [place] L. Sunderlin had no medications administered during this visit. Current Outpatient Medications  Medication Sig Dispense Refill  . escitalopram (LEXAPRO) 10 MG tablet Take 1 tablet (10 mg total) by mouth daily. 30 tablet 1  . omeprazole (PRILOSEC) 20 MG capsule Take 20 mg by mouth daily.    . Prenatal Vit-Fe Fumarate-FA (PRENATAL MULTIVITAMIN) TABS tablet Take 1 tablet by mouth daily at 12 noon.    Marland Kitchen  acetaminophen (TYLENOL) 325 MG tablet Take 2 tablets (650 mg total) by mouth every 4 (four) hours as needed (for pain scale < 4). 30 tablet 0  . albuterol (VENTOLIN HFA) 108 (90 Base) MCG/ACT inhaler Inhale 1-2 puffs into the lungs every 6 (six) hours as needed for wheezing or shortness of breath. 8 g 0  . baclofen (LIORESAL) 10 MG tablet Take 1 tablet (10 mg total) by mouth 3 (three) times daily. 30 each 0  . Blood Pressure Monitoring (BLOOD PRESSURE KIT) DEVI 1 kit by Does not apply route once a week. Check BP regularly. Large Cuff 1 kit 0  . cephALEXin (KEFLEX) 500 MG capsule 1 cap po qid x 7 days 28 capsule 0  . famotidine (PEPCID) 20 MG tablet Take 1 tablet (20 mg total) by mouth 2 (two) times daily. 30 tablet 0  . naproxen (NAPROSYN) 500 MG tablet Take 1 tablet (500 mg total) by mouth 2 (two) times daily with a meal. 30 tablet 0   No current facility-administered medications for this visit.    Allergies Patient has no known allergies.  Physical Exam:  General:  Alert, oriented and cooperative.   Mental Status: Normal mood and affect perceived. Normal judgment and thought content.  Rest of physical exam deferred due to type of encounter  PP Depression Screening:   Edinburgh Postnatal Depression Scale - 09/21/19 1056      Edinburgh Postnatal Depression Scale:  In the Past 7 Days   I have been able to laugh and see the funny side of things.  0    I have looked  forward with enjoyment to things.  0    I have blamed myself unnecessarily when things went wrong.  2    I have been anxious or worried for no good reason.  0    I have felt scared or panicky for no good reason.  0    Things have been getting on top of me.  0    I have been so unhappy that I have had difficulty sleeping.  0    I have felt sad or miserable.  0    I have been so unhappy that I have been crying.  0    The thought of harming myself has occurred to me.  0    Edinburgh Postnatal Depression Scale Total  2        Assessment:Patient is a 29 y.o. [id number] who is 6 weeks postpartum from a normal spontaneous vaginal delivery.  She is doing well.   Plan: 1. Postpartum care and examination - Return to work note will be sent to patient via MyChart  2. Generalized anxiety disorder - Patient does not have PCP to manage, referral to PCP sent  - Patient has Lexapro to cover 60 days, encouraged patient to call office for refill if unable to get into PCP by then, patient verbalizes understanding  - Ambulatory referral to [facility]   I discussed the assessment and treatment plan with the patient. The patient was provided an opportunity to ask questions and all were answered. The patient agreed with the plan and demonstrated an understanding of the instructions.   The patient was advised to call back or seek an in-person evaluation/go to the ED for any concerning postpartum symptoms.  I provided 8 minutes of non-face-to-face time during this encounter.   Lajean Manes, [place] for [employer], [place]

## 2019-10-05 ENCOUNTER — Encounter: Payer: Self-pay | Admitting: Certified Nurse Midwife

## 2019-10-18 ENCOUNTER — Encounter: Payer: Self-pay | Admitting: Certified Nurse Midwife

## 2019-10-22 ENCOUNTER — Other Ambulatory Visit: Payer: Self-pay

## 2019-10-22 ENCOUNTER — Encounter: Payer: Self-pay | Admitting: Family Medicine

## 2019-10-22 ENCOUNTER — Telehealth (INDEPENDENT_AMBULATORY_CARE_PROVIDER_SITE_OTHER): Payer: Medicaid Other | Admitting: Family Medicine

## 2019-10-22 DIAGNOSIS — F325 Major depressive disorder, single episode, in full remission: Secondary | ICD-10-CM | POA: Diagnosis not present

## 2019-10-22 DIAGNOSIS — Z7689 Persons encountering health services in other specified circumstances: Secondary | ICD-10-CM | POA: Diagnosis not present

## 2019-10-22 DIAGNOSIS — F411 Generalized anxiety disorder: Secondary | ICD-10-CM

## 2019-10-22 MED ORDER — ESCITALOPRAM OXALATE 10 MG PO TABS: 10.0000 mg | ORAL_TABLET | Freq: Every day | ORAL | 4 refills | Status: DC

## 2019-10-22 NOTE — Progress Notes (Signed)
Virtual Visit via Telephone Note changed from Video Visit due to technical problems  I connected with Amy Decker on 10/22/19 at 10:10 AM EDT by telephone and verified that I am speaking with the correct person using two identifiers.  Location: Patient: Home  Provider: Office    I discussed the limitations, risks, security and privacy concerns of performing an evaluation and management service by telephone and the availability of in person appointments. I also discussed with the patient that there may be a patient responsible charge related to this service. The patient expressed understanding and agreed to proceed.   History of Present Illness: Amy Decker present during today's encounter to establish care and medication refill.  Patient is postpartum and has been followed by her OBGYN up until March. She has medical history significant for depression and anxiety. She was prescribed Lexapro by OB and reports medication has greatly improved her symptoms. Endorses experiencing insomnia and increase sweating since starting medication, other no other issues. Denies any current symptoms of depression or anxiety. Endorses coping well at present with being a new mother. Denies thoughts of self harm or harming others and or auditory hallucinations.  PHQ9 SCORE ONLY 10/22/2019 09/06/2019  Score 0 8   GAD 7 : Generalized Anxiety Score 10/22/2019  Nervous, Anxious, on Edge 1  Control/stop worrying 0  Worry too much - different things 0  Trouble relaxing 1  Restless 0  Easily annoyed or irritable 0   Observations/Objective: Speaking in clear sentences.  Answering questions appropriately. No audible distress during encounter.  Assessment and Plan: 1. Encounter to establish care 2. Generalized anxiety disorder, GAD 2, symptoms stable -Continue escitalopram 10 MG table, take one tablet daily.  Recommended taking medication in the morning due to side effects of insomnia when taking at  bedtime.  3. Major depressive disorder in full remission, unspecified whether recurrent (HCC) -Depression screening negative today, 0/0. --Continue escitalopram 10 MG table, take one tablet daily.  Recommended taking medication in the morning due to side effects of insomnia when taking at bedtime.  Follow Up Instructions: October for annual physical  Sooner if needed.   I discussed the assessment and treatment plan with the patient. The patient was provided an opportunity to ask questions and all were answered. The patient agreed with the plan and demonstrated an understanding of the instructions.   The patient was advised to call back or seek an in-person evaluation if the symptoms worsen or if the condition fails to improve as anticipated.  I provided 20 minutes of non-face-to-face time during this encounter.   Joaquin Courts, FNP-C

## 2019-10-23 IMAGING — US US MFM OB DETAIL+14 WK
1 series · 14 of 28 positions shown · non-contrast
Comparison: none

[Series 1: us mfm ob detail+14 wk · 116 acquisitions, 14 frames shown]
[im 5/116]
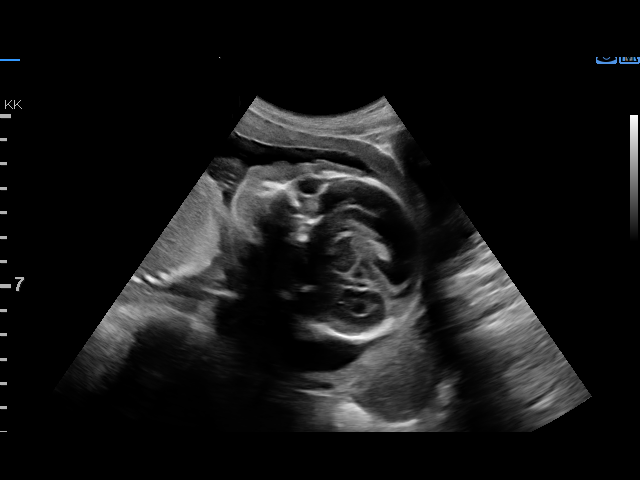
[im 13/116]
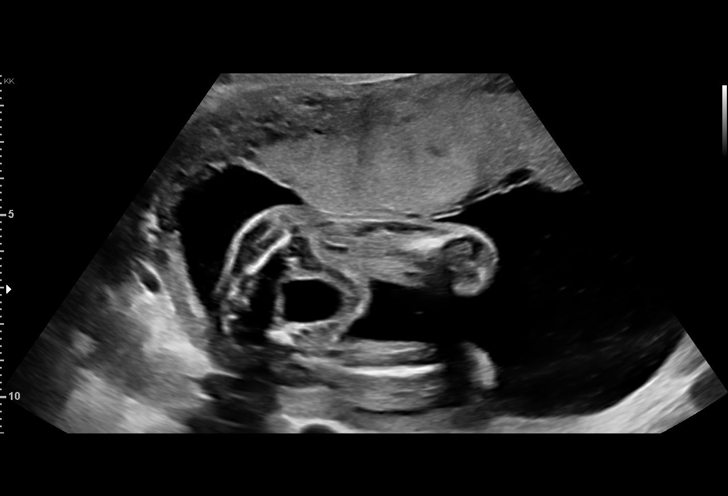
[im 22/116]
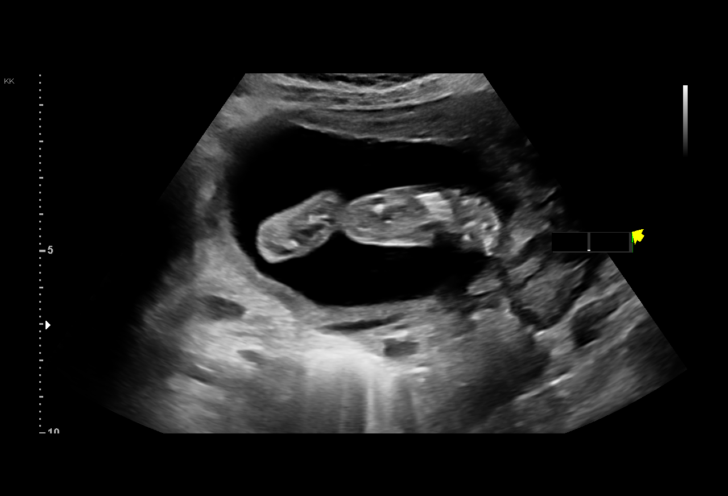
[im 30/116]
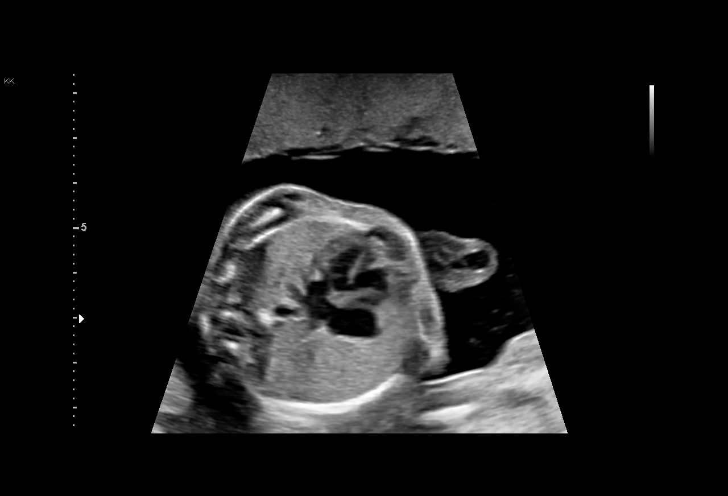
[im 39/116]
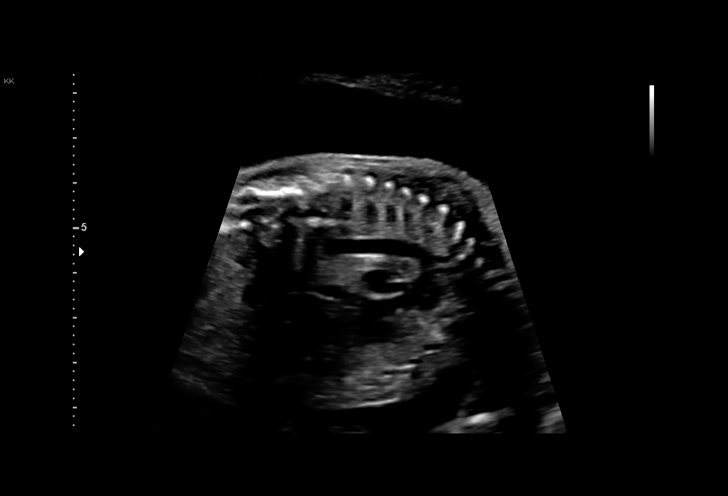
[im 47/116]
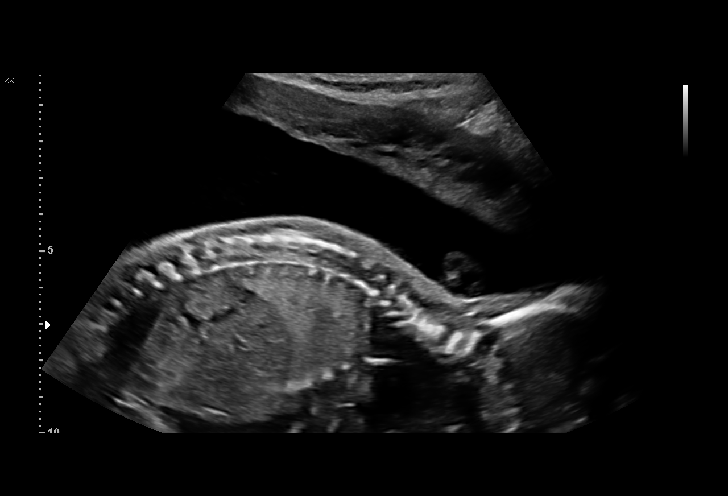
[im 56/116]
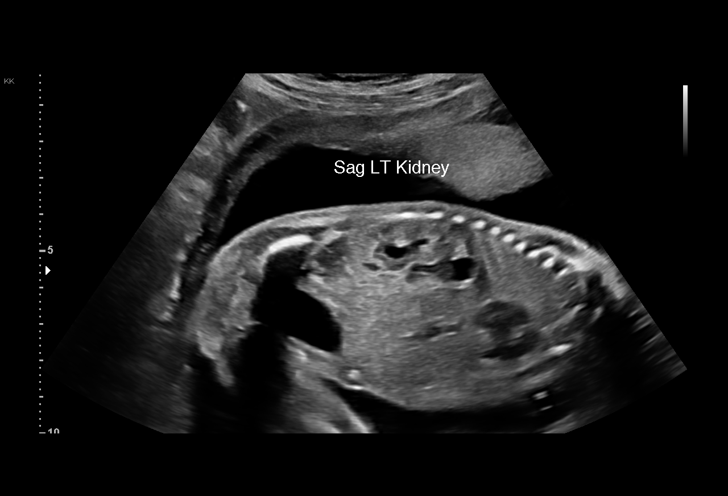
[im 64/116]
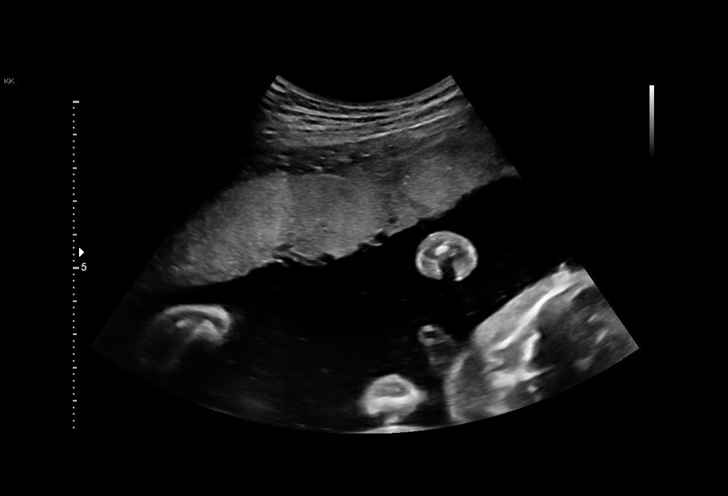
[im 73/116]
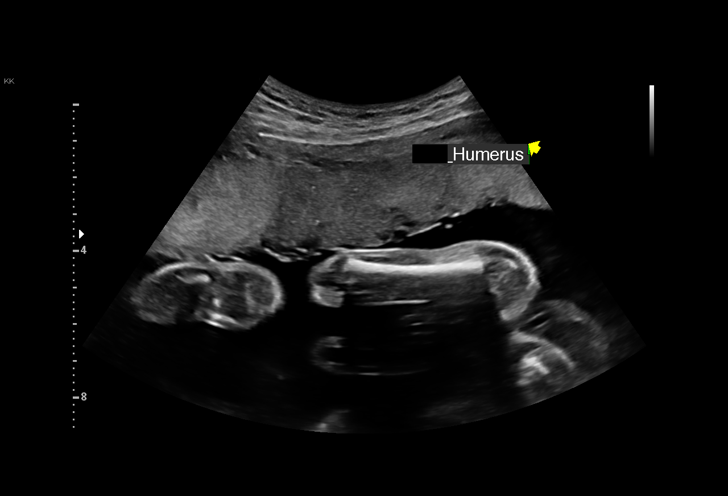
[im 81/116]
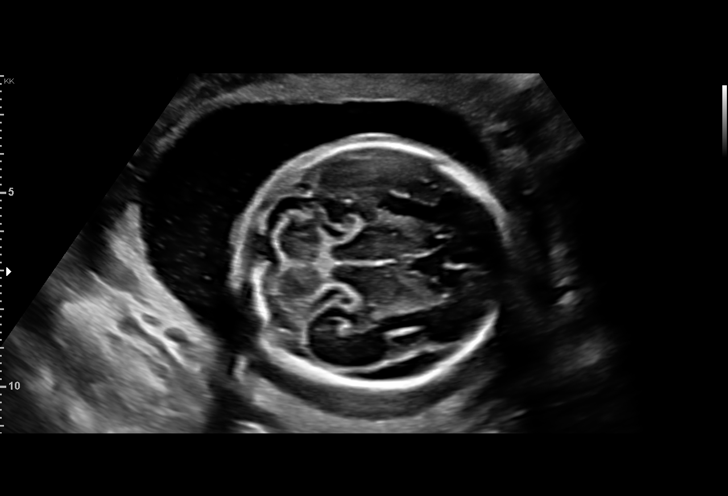
[im 90/116]
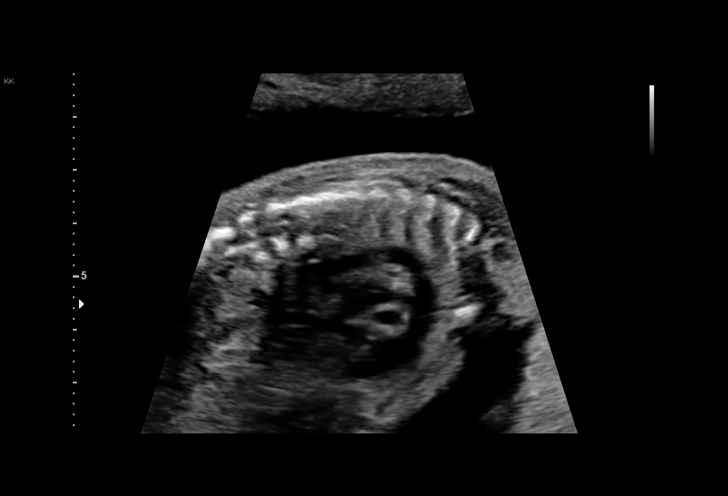
[im 98/116]
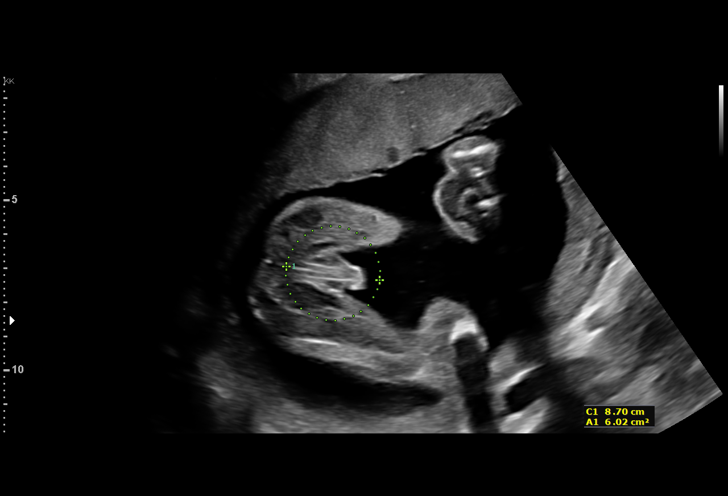
[im 107/116]
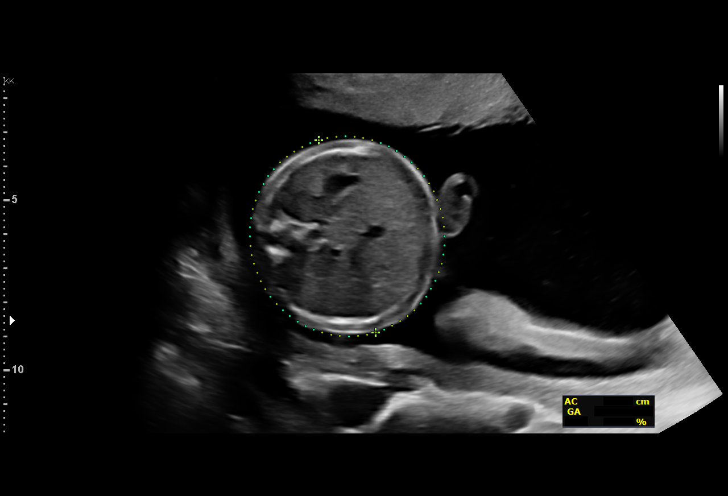
[im 116/116]
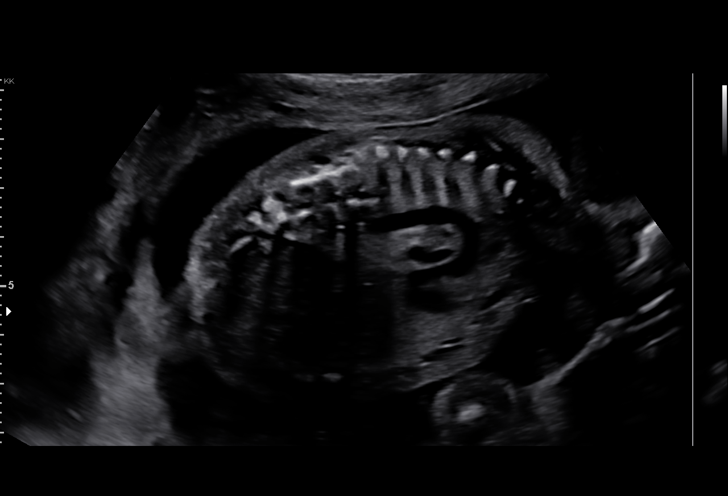

[14 of 28 positions shown; findings below may reference images not displayed]

----------------------------------------------------------------------

 ----------------------------------------------------------------------
Indications

  Encounter for antenatal screening for
  malformations
  Late prenatal care, second trimester
  Marginal insertion of umbilical cord affecting
  management of mother in second trimester
  23 weeks gestation of pregnancy
 ----------------------------------------------------------------------
Fetal Evaluation

 Num Of Fetuses:         1
 Fetal Heart Rate(bpm):  136
 Cardiac Activity:       Observed
 Presentation:           Cephalic
 Placenta:               Anterior
 P. Cord Insertion:      Marginal insertion

 Amniotic Fluid
 AFI FV:      Within normal limits

                             Largest Pocket(cm)

Biometry

 BPD:      62.2  mm     G. Age:  25w 2d         94  %    CI:        80.48   %    70 - 86
                                                         FL/HC:      18.4   %    19.2 -
 HC:       219   mm     G. Age:  24w 0d         54  %    HC/AC:      1.17        1.05 -
 AC:      186.9  mm     G. Age:  23w 3d         43  %    FL/BPD:     64.8   %    71 - 87
 FL:       40.3  mm     G. Age:  23w 0d         25  %    FL/AC:      21.6   %    20 - 24
 HUM:      37.8  mm     G. Age:  23w 2d         40  %

 Est. FW:     594  gm      1 lb 5 oz     42  %
OB History

 Gravidity:    3         Term:   2        Prem:   0        SAB:   0
 TOP:          0       Ectopic:  0        Living: 2
Gestational Age

 LMP:           23w 3d        Date:  10/31/18                 EDD:   08/07/19
 U/S Today:     24w 0d                                        EDD:   08/03/19
 Best:          23w 3d     Det. By:  LMP  (10/31/18)          EDD:   08/07/19
Anatomy

 Cranium:               Appears normal         LVOT:                   Appears normal
 Cavum:                 Appears normal         Aortic Arch:            Appears normal
 Ventricles:            Appears normal         Ductal Arch:            Not well visualized
 Choroid Plexus:        Appears normal         Diaphragm:              Appears normal
 Cerebellum:            Appears normal         Stomach:                Appears normal, left
                                                                       sided
 Posterior Fossa:       Appears normal         Abdomen:                Appears normal
 Nuchal Fold:           Not applicable (>20    Abdominal Wall:         Appears nml (cord
                        wks GA)                                        insert, abd wall)
 Face:                  Not well visualized    Cord Vessels:           Appears normal (3
                                                                       vessel cord)
 Lips:                  Appears normal         Kidneys:                Appear normal
 Palate:                Not well visualized    Bladder:                Appears normal
 Thoracic:              Appears normal         Spine:                  Appears normal
 Heart:                 Appears normal         Upper Extremities:      Appears normal
                        (4CH, axis, and
                        situs)
 RVOT:                  Appears normal         Lower Extremities:      Appears normal

 Other:  Male gender. Heels visualized. Technically difficult due to fetal
         position.
Cervix Uterus Adnexa

 Cervix
 Length:              3  cm.
 Normal appearance by transabdominal scan.
Impression

 Normal interval growth.  No ultrasonic evidence of structural
 fetal anomalies.
 Suboptimal views of the fetal anatomy obtained secondary to
 fetal position.
 Marginal placental cord insertion.
 NIPS pending
Recommendations

 Follow up growth scheduled in 4 weeks.

## 2019-10-28 ENCOUNTER — Ambulatory Visit: Payer: Medicaid Other

## 2019-11-18 ENCOUNTER — Telehealth: Payer: Self-pay

## 2019-11-18 NOTE — Telephone Encounter (Signed)
Called patient to do their pre-visit COVID screening.  Call went to voicemail. Unable to do prescreening.  

## 2019-11-19 ENCOUNTER — Encounter: Payer: Medicaid Other | Admitting: Internal Medicine

## 2019-12-26 ENCOUNTER — Emergency Department (HOSPITAL_COMMUNITY)
Admission: EM | Admit: 2019-12-26 | Discharge: 2019-12-27 | Disposition: A | Discharge status: Home / Self Care | Payer: Medicaid Other | Attending: Emergency Medicine | Admitting: Emergency Medicine

## 2019-12-26 ENCOUNTER — Telehealth: Payer: Medicaid Other | Admitting: Physician Assistant

## 2019-12-26 ENCOUNTER — Encounter (HOSPITAL_COMMUNITY): Payer: Self-pay | Admitting: Emergency Medicine

## 2019-12-26 ENCOUNTER — Other Ambulatory Visit: Payer: Self-pay

## 2019-12-26 DIAGNOSIS — M545 Low back pain: Secondary | ICD-10-CM | POA: Insufficient documentation

## 2019-12-26 DIAGNOSIS — Z5321 Procedure and treatment not carried out due to patient leaving prior to being seen by health care provider: Secondary | ICD-10-CM | POA: Diagnosis not present

## 2019-12-26 DIAGNOSIS — M549 Dorsalgia, unspecified: Secondary | ICD-10-CM

## 2019-12-26 NOTE — Progress Notes (Signed)
Hi Amy Decker,   I am sorry you are in pain.  According to your chart, you had an E-visit for this problem on 09/01/19 and 09/08/19. The provider at your most recent E-visit recommended that you be seen face-to-face for this problem.  It sounds like your pain is more severe now than it was a few months ago.    I do not feel comfortable treating you virtually for this problem, as you need a physical exam and possible imaging of your spine.   Please schedule an appointment with your PCP soon.  If you are unable to get an appointment soon, you can go to one of our Urgent Care clinics.   Based on what you shared with me, I feel your condition warrants further evaluation and I recommend that you be seen for a face to face office visit.   NOTE: If you entered your credit card information for this eVisit, you will not be charged. You may see a "hold" on your card for the $35 but that hold will drop off and you will not have a charge processed.   If you are having a true medical emergency please call 911.      For an urgent face to face visit, [place] has five urgent care centers for your convenience:      NEW:  [facility] at [facility] Directions [phone] [address] [place], [place] [address] . 10 am - 6pm Monday - Friday    [facility]) Get Driving Directions [phone] [address] [place], [place] [address] . 10 am to 8 pm Monday-Friday . 12 pm to 8 pm [facility]gent Care at [facility] Get Driving Directions [phone] [address] [place], [place] [address] . 8 am to 8 pm Monday-Friday . 9 am to 6 pm Saturday . 11 am to 6 pm Sunday     [facility] at [facility] Get Driving Directions  [phone] [address] [place], [place] [address] . 8 am to 8 pm Monday-Friday . 8 am to 4 pm [facility] Urgent Care at [facility] Directions [phone] [address] [place], [place] [address] . 12 pm to 6 pm Monday-Friday      Your e-visit answers were reviewed by a board certified advanced clinical practitioner to complete your personal care plan.  Thank you for using e-Visits.

## 2019-12-26 NOTE — ED Triage Notes (Signed)
Pt complains of sudden onset of lower back pain, pain moves down her right leg.  OTC medication does not help.

## 2019-12-27 ENCOUNTER — Encounter (HOSPITAL_COMMUNITY): Payer: Self-pay

## 2019-12-27 ENCOUNTER — Ambulatory Visit
Admission: EM | Admit: 2019-12-27 | Discharge: 2019-12-27 | Discharge status: Absent without leave | Payer: Medicaid Other | Source: Home / Self Care

## 2019-12-27 ENCOUNTER — Encounter: Payer: Self-pay | Admitting: Internal Medicine

## 2019-12-27 ENCOUNTER — Inpatient Hospital Stay
Admission: RE | Admit: 2019-12-27 | Discharge: 2019-12-27 | Disposition: A | Discharge status: Home / Self Care | Payer: Medicaid Other | Source: Ambulatory Visit

## 2019-12-27 ENCOUNTER — Telehealth (INDEPENDENT_AMBULATORY_CARE_PROVIDER_SITE_OTHER): Payer: Medicaid Other | Admitting: Internal Medicine

## 2019-12-27 ENCOUNTER — Emergency Department (HOSPITAL_COMMUNITY)
Admission: EM | Admit: 2019-12-27 | Discharge: 2019-12-28 | Disposition: A | Discharge status: Home / Self Care | Payer: Medicaid Other | Source: Home / Self Care | Attending: Emergency Medicine | Admitting: Emergency Medicine

## 2019-12-27 ENCOUNTER — Telehealth: Payer: Medicaid Other

## 2019-12-27 DIAGNOSIS — R5383 Other fatigue: Secondary | ICD-10-CM

## 2019-12-27 DIAGNOSIS — M545 Low back pain, unspecified: Secondary | ICD-10-CM

## 2019-12-27 DIAGNOSIS — F17210 Nicotine dependence, cigarettes, uncomplicated: Secondary | ICD-10-CM | POA: Insufficient documentation

## 2019-12-27 LAB — URINALYSIS, ROUTINE W REFLEX MICROSCOPIC
Bilirubin Urine: NEGATIVE
Glucose, UA: NEGATIVE mg/dL
Hgb urine dipstick: NEGATIVE
Ketones, ur: NEGATIVE mg/dL
Leukocytes,Ua: NEGATIVE
Nitrite: NEGATIVE
Protein, ur: NEGATIVE mg/dL
Specific Gravity, Urine: 1.021 (ref 1.005–1.030)
pH: 6 (ref 5.0–8.0)

## 2019-12-27 MED ORDER — OXYCODONE-ACETAMINOPHEN 5-325 MG PO TABS
1.0000 | ORAL_TABLET | Freq: Once | ORAL | Status: AC
  Administered 2019-12-27: 1 via ORAL
  Filled 2019-12-27: qty 1

## 2019-12-27 NOTE — Progress Notes (Signed)
Virtual Visit via Telephone Note  I connected with Amy Decker, on 12/27/2019 at 1:42 PM by telephone due to the COVID-19 pandemic and verified that I am speaking with the correct person using two identifiers.   Consent: I discussed the limitations, risks, security and privacy concerns of performing an evaluation and management service by telephone and the availability of in person appointments. I also discussed with the patient that there may be a patient responsible charge related to this service. The patient expressed understanding and agreed to proceed.   Location of Patient: Home   Location of Provider: Clinic    Persons participating in Telemedicine visit: Mckynna [facility] Dr. Earlene Plater    History of Present Illness: Patient has a visit for concern for constant fatigue. Has been occurring for 2-3 weeks. Feeling overly tired and doesn't know why. Sleeping at least 7-8 hours per night. Even when she wakes up she feels tired and then drags throughout the day. She delivered a child in February. No major changes in her life. She denies sore throat, nasal congestion, fevers, SOB, chest pain. Is having nighttime awakenings but falls back asleep for the most part fairly quickly. No nighttime awakenings with SOB, chest tightness, gasping for air. No AM headaches. Not sure if snores. She has been on Lexapro for about 4-5 months. This was started for anxiety and reports it has helped tremendously. No known sick contacts for mono, covid, etc. Denies recent illnesses.    Past Medical History:  Diagnosis Date  . Peripheral vascular disease (HCC)    No Known Allergies  Current Outpatient Medications on File Prior to Visit  Medication Sig Dispense Refill  . escitalopram (LEXAPRO) 10 MG tablet Take 1 tablet (10 mg total) by mouth daily. 90 tablet 4  . omeprazole (PRILOSEC) 20 MG capsule Take 20 mg by mouth daily.     No current facility-administered medications on  file prior to visit.    Observations/Objective: NAD. Speaking clearly.  Work of breathing normal.  Alert and oriented. Mood appropriate.   Assessment and Plan: 1. Fatigue, unspecified type Will start a work up with some basic labs. Does not sound infectious in nature and patient denies recent illnesses making post viral fatigue syndrome unlikely. She denies recent stressors and reports anxiety well controlled. Could be a side effect of her Lexapro; however, would have anticipated that she would have noted symptoms closer to start date of medication. She is achieving adequate sleep at night per history and does not seem to have any symptoms of OSA and her BMI is 21 making this less likely. Will start with this initial work up and if unremarkable, will monitor symptoms and plan for more extensive work up if symptoms persist.  - POCT urine pregnancy; Future - Basic metabolic panel; Future - CBC; Future - TSH; Future - Vitamin B12; Future - VITAMIN D 25 Hydroxy (Vit-D Deficiency, Fractures); Future   Follow Up Instructions: Lab visit 6/29    I discussed the assessment and treatment plan with the patient. The patient was provided an opportunity to ask questions and all were answered. The patient agreed with the plan and demonstrated an understanding of the instructions.   The patient was advised to call back or seek an in-person evaluation if the symptoms worsen or if the condition fails to improve as anticipated.     I provided 14 minutes total of non-face-to-face time during this encounter including median intraservice time, reviewing previous notes, investigations, ordering medications, medical  decision making, coordinating care and patient verbalized understanding at the end of the visit.    Amy Decker, D.O. Primary Care at [facility]  12/27/2019, 1:42 PM

## 2019-12-27 NOTE — ED Triage Notes (Signed)
Pt reports lower back pain for the past 3 days, no injury reported.

## 2019-12-28 ENCOUNTER — Other Ambulatory Visit: Payer: Medicaid Other

## 2019-12-28 LAB — PREGNANCY, URINE: Preg Test, Ur: NEGATIVE

## 2019-12-28 MED ORDER — IBUPROFEN 400 MG PO TABS
600.0000 mg | ORAL_TABLET | Freq: Once | ORAL | Status: AC
  Administered 2019-12-28: 600 mg via ORAL
  Filled 2019-12-28: qty 1

## 2019-12-28 MED ORDER — IBUPROFEN 600 MG PO TABS: 600.0000 mg | ORAL_TABLET | Freq: Three times a day (TID) | ORAL | 0 refills | Status: DC | PRN

## 2019-12-28 MED ORDER — METHOCARBAMOL 500 MG PO TABS: 500.0000 mg | ORAL_TABLET | Freq: Three times a day (TID) | ORAL | 0 refills | Status: DC | PRN

## 2019-12-28 NOTE — ED Provider Notes (Signed)
[facility] EMERGENCY DEPARTMENT Provider Note   CSN: [id number] Arrival date & time: 12/27/19  1736     History Chief Complaint  Patient presents with  . Back Pain    Amy Decker is a 29 y.o. female.  HPI 29 year old female presents with low back pain.  Started about 3 days ago.  No trauma associated with it.  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  Pain is diffuse across her low back.  Sometimes she will get a jolt of pain.  She denies any radiation of the pain, especially down her legs.  No abdominal pain.  Has taken Tylenol with intermittent partial relief.   Past Medical History:  Diagnosis Date  . Peripheral vascular disease [facility])     Patient Active Problem List   Diagnosis Date Noted  . Genetic testing 08/06/2019  . Premature rupture of membranes 08/05/2019  . Marginal insertion of umbilical cord affecting management of mother 05/07/2019  . Late prenatal care affecting pregnancy 04/06/2019  . Supervision of other normal pregnancy, antepartum 03/30/2019    Past Surgical History:  Procedure Laterality Date  . CHOLECYSTECTOMY    . TONSILLECTOMY       OB History    Gravida  3   Para  3   Term  3   Preterm      AB      Living  3     SAB      TAB      Ectopic      Multiple  0   Live Births  3           Family History  Problem Relation Age of Onset  . Arthritis Mother   . Cancer Mother   . Hypertension Maternal Grandfather     Social History   Tobacco Use  . Smoking status: Current Every Day Smoker    Types: Cigarettes  . Smokeless tobacco: Never Used  Vaping Use  . Vaping Use: Former  Substance Use Topics  . Alcohol use: Never  . Drug use: Never    Home Medications Prior to Admission medications   Medication Sig Start Date End Date Taking? Authorizing Provider  escitalopram (LEXAPRO) 10 MG tablet Take 1 tablet (10 mg total) by mouth daily. 10/22/19   Bing Neighbors, FNP   ibuprofen (ADVIL) 600 MG tablet Take 1 tablet (600 mg total) by mouth every 8 (eight) hours as needed. 12/28/19   Pricilla Loveless, MD  methocarbamol (ROBAXIN) 500 MG tablet Take 1 tablet (500 mg total) by mouth every 8 (eight) hours as needed for muscle spasms. 12/28/19   Pricilla Loveless, MD  omeprazole (PRILOSEC) 20 MG capsule Take 20 mg by mouth daily.    [provider]    Allergies    Patient has no known allergies.  Review of Systems   Review of Systems  Constitutional: Negative for fever.  Gastrointestinal: Negative for abdominal pain.  Genitourinary: Negative for dysuria and hematuria.  Musculoskeletal: Positive for back pain.  Neurological: Negative for weakness and numbness.    Physical Exam Updated Vital Signs BP 117/69 (BP Location: Right Arm)   Pulse 86   Temp 98.7 F (37.1 C) (Oral)   Resp 16   Ht 5\' 5"  (1.651 m)   Wt 59 kg   LMP 12/02/2019 (Within Days)   SpO2 99%   BMI 21.64 kg/m   Physical Exam Vitals and nursing note reviewed.  Constitutional:  General: She is not in acute distress.    Appearance: She is well-developed. She is not ill-appearing or diaphoretic.  HENT:     Head: Normocephalic and atraumatic.     Right Ear: External ear normal.     Left Ear: External ear normal.     Nose: Nose normal.  Eyes:     General:        Right eye: No discharge.        Left eye: No discharge.  Cardiovascular:     Rate and Rhythm: Normal rate and regular rhythm.     Heart sounds: Normal heart sounds.  Pulmonary:     Effort: Pulmonary effort is normal.     Breath sounds: Normal breath sounds.  Abdominal:     General: There is no distension.     Palpations: Abdomen is soft.     Tenderness: There is no abdominal tenderness. There is no right CVA tenderness or left CVA tenderness.  Musculoskeletal:     Thoracic back: No tenderness.     Lumbar back: Tenderness (mild, diffuse) present.  Skin:    General: Skin is warm and dry.  Neurological:      Mental Status: She is alert.     Comments: 5/5 strength in BLE. Normal gross sensation  Psychiatric:        Mood and Affect: Mood is not anxious.     ED Results / Procedures / Treatments   Labs (all labs ordered are listed, but only abnormal results are displayed) Labs Reviewed  URINALYSIS, ROUTINE W REFLEX MICROSCOPIC  PREGNANCY, URINE    EKG None  Radiology No results found.  Procedures Procedures (including critical care time)  Medications Ordered in ED Medications  ibuprofen (ADVIL) tablet 600 mg (has no administration in time range)  oxyCODONE-acetaminophen (PERCOCET/ROXICET) 5-325 MG per tablet 1 tablet (1 tablet Oral Given 12/27/19 1858)    ED Course  I have reviewed the triage vital signs and the nursing notes.  Pertinent labs & imaging results that were available during my care of the patient were reviewed by me and considered in my medical decision making (see chart for details).    MDM Rules/Calculators/A&P                          Patient appears to have atraumatic low back pain without radicular symptoms.  No signs or symptoms of more severe disease such as spinal cord emergency.  Imaging at this time would not be helpful.  Will recommend ibuprofen in addition to Tylenol and prescribed short course of muscle relaxer.  Follow-up with PCP.  We discussed return precautions. Final Clinical Impression(s) / ED Diagnoses Final diagnoses:  Acute bilateral low back pain without sciatica    Rx / DC Orders ED Discharge Orders         Ordered    methocarbamol (ROBAXIN) 500 MG tablet  Every 8 hours PRN     Discontinue  Reprint     12/28/19 0903    ibuprofen (ADVIL) 600 MG tablet  Every 8 hours PRN     Discontinue  Reprint     12/28/19 2841           Pricilla Loveless, MD 12/28/19 [phone]

## 2019-12-28 NOTE — Discharge Instructions (Signed)
If you develop worsening, recurrent, or continued back pain, numbness or weakness in the legs, incontinence of your bowels or bladders, numbness of your buttocks, fever, abdominal pain, or any other new/concerning symptoms then return to the ER for evaluation.  

## 2019-12-31 ENCOUNTER — Ambulatory Visit (HOSPITAL_COMMUNITY)
Admission: EM | Admit: 2019-12-31 | Discharge: 2019-12-31 | Disposition: A | Discharge status: Home / Self Care | Payer: Medicaid Other | Attending: Urgent Care | Admitting: Urgent Care

## 2019-12-31 ENCOUNTER — Other Ambulatory Visit: Payer: Self-pay

## 2019-12-31 DIAGNOSIS — M545 Low back pain, unspecified: Secondary | ICD-10-CM

## 2019-12-31 DIAGNOSIS — M546 Pain in thoracic spine: Secondary | ICD-10-CM

## 2019-12-31 DIAGNOSIS — M62830 Muscle spasm of back: Secondary | ICD-10-CM

## 2019-12-31 DIAGNOSIS — M549 Dorsalgia, unspecified: Secondary | ICD-10-CM | POA: Diagnosis not present

## 2019-12-31 DIAGNOSIS — R109 Unspecified abdominal pain: Secondary | ICD-10-CM

## 2019-12-31 DIAGNOSIS — S39012A Strain of muscle, fascia and tendon of lower back, initial encounter: Secondary | ICD-10-CM

## 2019-12-31 LAB — POCT URINALYSIS DIP (DEVICE)
Bilirubin Urine: NEGATIVE
Glucose, UA: NEGATIVE mg/dL
Hgb urine dipstick: NEGATIVE
Ketones, ur: NEGATIVE mg/dL
Leukocytes,Ua: NEGATIVE
Nitrite: NEGATIVE
Protein, ur: NEGATIVE mg/dL
Specific Gravity, Urine: 1.025 (ref 1.005–1.030)
Urobilinogen, UA: 0.2 mg/dL (ref 0.0–1.0)
pH: 7 (ref 5.0–8.0)

## 2019-12-31 MED ORDER — MELOXICAM 7.5 MG PO TABS: 7.5000 mg | ORAL_TABLET | Freq: Two times a day (BID) | ORAL | 1 refills | Status: DC | PRN

## 2019-12-31 MED ORDER — TIZANIDINE HCL 4 MG PO TABS: 4.0000 mg | ORAL_TABLET | Freq: Three times a day (TID) | ORAL | 0 refills | Status: DC | PRN

## 2019-12-31 NOTE — ED Provider Notes (Signed)
[facility]   MRN: [id number] DOB: 21-Nov-1990  Subjective:   Amy Decker is a 29 y.o. female presenting for 1 week hx of persistent right-sided flank pain, bilateral low back pain, right-sided mid to lower back pain.  Patient was seen in the emergency room about 4 to 5 days ago, prescribed ibuprofen and methocarbamol.  Urinalysis and pregnancy test were negative.  Patient denies fever, nausea, vomiting, belly pain, dysuria, hematuria, urinary frequency.  Patient denies history of UTIs, kidney stones.  She does work long hours at [employer], does a lot of standing; bending, twisting at the level of the back.  Denies falls, trauma.  She does have to lift intermittently at work.  No current facility-administered medications for this encounter.  Current Outpatient Medications:  .  escitalopram (LEXAPRO) 10 MG tablet, Take 1 tablet (10 mg total) by mouth daily., Disp: 90 tablet, Rfl: 4 .  ibuprofen (ADVIL) 600 MG tablet, Take 1 tablet (600 mg total) by mouth every 8 (eight) hours as needed., Disp: 15 tablet, Rfl: 0 .  methocarbamol (ROBAXIN) 500 MG tablet, Take 1 tablet (500 mg total) by mouth every 8 (eight) hours as needed for muscle spasms., Disp: 15 tablet, Rfl: 0 .  omeprazole (PRILOSEC) 20 MG capsule, Take 20 mg by mouth daily., Disp: , Rfl:    No Known Allergies  Past Medical History:  Diagnosis Date  . Peripheral vascular disease [facility])      Past Surgical History:  Procedure Laterality Date  . CHOLECYSTECTOMY    . TONSILLECTOMY      Family History  Problem Relation Age of Onset  . Arthritis Mother   . Cancer Mother   . Hypertension Maternal Grandfather     Social History   Tobacco Use  . Smoking status: Current Every Day Smoker    Types: Cigarettes  . Smokeless tobacco: Never Used  Vaping Use  . Vaping Use: Former  Substance Use Topics  . Alcohol use: Never  . Drug use: Never    ROS   Objective:   Vitals: BP 114/76 (BP Location: Left  Arm)   Pulse (!) 58   Temp 98.2 F (36.8 C) (Oral)   Resp 16   LMP 12/02/2019 (Within Days)   SpO2 100%   Physical Exam Constitutional:      General: She is not in acute distress.    Appearance: Normal appearance. She is well-developed. She is not ill-appearing, toxic-appearing or diaphoretic.  HENT:     Head: Normocephalic and atraumatic.     Nose: Nose normal.     Mouth/Throat:     Mouth: Mucous membranes are moist.     Pharynx: Oropharynx is clear.  Eyes:     General: No scleral icterus.       Right eye: No discharge.        Left eye: No discharge.     Extraocular Movements: Extraocular movements intact.     Conjunctiva/sclera: Conjunctivae normal.     Pupils: Pupils are equal, round, and reactive to light.  Cardiovascular:     Rate and Rhythm: Normal rate.  Pulmonary:     Effort: Pulmonary effort is normal.  Musculoskeletal:     Thoracic back: Spasms and tenderness (over areas outlined) present. No swelling, edema, deformity, signs of trauma, lacerations or bony tenderness. Decreased range of motion (favors right flank/thoracic back). No scoliosis.     Lumbar back: Spasms and tenderness present. No swelling, edema, deformity, signs of trauma, lacerations or bony tenderness. Decreased  range of motion. Negative right straight leg raise test and negative left straight leg raise test. No scoliosis.       Back:  Skin:    General: Skin is warm and dry.  Neurological:     General: No focal deficit present.     Mental Status: She is alert and oriented to person, place, and time.  Psychiatric:        Mood and Affect: Mood normal.        Behavior: Behavior normal.        Thought Content: Thought content normal.        Judgment: Judgment normal.     Results for orders placed or performed during the hospital encounter of 12/31/19 (from the past 24 hour(s))  POCT urinalysis dip (device)     Status: None   Collection Time: 12/31/19  6:44 PM  Result Value Ref Range   Glucose,  UA NEGATIVE NEGATIVE mg/dL   Bilirubin Urine NEGATIVE NEGATIVE   Ketones, ur NEGATIVE NEGATIVE mg/dL   Specific Gravity, Urine 1.025 1.005 - 1.030   Hgb urine dipstick NEGATIVE NEGATIVE   pH 7.0 5.0 - 8.0   Protein, ur NEGATIVE NEGATIVE mg/dL   Urobilinogen, UA 0.2 0.0 - 1.0 mg/dL   Nitrite NEGATIVE NEGATIVE   Leukocytes,Ua NEGATIVE NEGATIVE    Assessment and Plan :   PDMP not reviewed this encounter.  1. Acute bilateral low back pain without sciatica   2. Right flank pain   3. Acute right-sided thoracic back pain   4. Muscle spasm of back   5. Back strain, initial encounter     Will manage with meloxicam, tizanidine for back strain likely related to her work.  Maintain daily adequate hydration.  Note for work provided.  No evidence of UTI/pyelonephritis. Counseled patient on potential for adverse effects with medications prescribed/recommended today, ER and return-to-clinic precautions discussed, patient verbalized understanding.    Wallis Bamberg, [place] 12/31/19 [phone]

## 2019-12-31 NOTE — ED Triage Notes (Signed)
Pt c/o acute lower back pain approx 1 week; now right side worse than left.   Denies injury/trauma to area, dysuria sx, numbness to feet. Pt was seen at [place] for same complaint last weekend and pt reports Rx given are not helping her pain.

## 2020-01-06 ENCOUNTER — Other Ambulatory Visit: Payer: Medicaid Other

## 2020-01-12 ENCOUNTER — Inpatient Hospital Stay
Admission: RE | Admit: 2020-01-12 | Discharge: 2020-01-12 | Disposition: A | Discharge status: Home / Self Care | Payer: Medicaid Other | Source: Ambulatory Visit

## 2020-01-12 ENCOUNTER — Telehealth: Payer: Medicaid Other | Admitting: Family

## 2020-01-12 DIAGNOSIS — L03011 Cellulitis of right finger: Secondary | ICD-10-CM

## 2020-01-12 MED ORDER — CEPHALEXIN 500 MG PO CAPS: 500.0000 mg | ORAL_CAPSULE | Freq: Four times a day (QID) | ORAL | 0 refills | Status: AC

## 2020-01-12 NOTE — Progress Notes (Signed)
E Visit for Cellulitis  We are sorry that you are not feeling well. Here is how we plan to help!  Based on what you shared with me it looks like you have paronychia infection.  Paronychia is an infection of the tip of the finger that has  redness, swelling, and warmth; it develops as a result of bacteria entering under the skin. Little red spots and/or bleeding can be seen in skin, and tiny surface sacs containing fluid can occur. Fever can be present.   I have prescribed:  Keflex 500mg  take one by mouth four times a day for 5 days  HOME CARE:  . Take your medications as ordered and take all of them, even if the skin irritation appears to be healing.   GET HELP RIGHT AWAY IF:  . Symptoms that don't begin to go away within 48 hours. . Severe redness persists or worsens . If the area turns color, spreads or swells. . If it blisters and opens, develops yellow-brown crust or bleeds. . You develop a fever or chills. . If the pain increases or becomes unbearable.  . Are unable to keep fluids and food down.  MAKE SURE YOU    Understand these instructions.  Will watch your condition.  Will get help right away if you are not doing well or get worse.  Thank you for choosing an e-visit. Your e-visit answers were reviewed by a board certified advanced clinical practitioner to complete your personal care plan. Depending upon the condition, your plan could have included both over the counter or prescription medications. Please review your pharmacy choice. Make sure the pharmacy is open so you can pick up prescription now. If there is a problem, you may contact your provider through and have the prescription routed to another pharmacy. Your safety is important to [employer]. If you have drug allergies check your prescription carefully.  For the next 24 hours you can use MyChart to ask questions about today's visit, request a non-urgent call back, or ask for a work or school excuse. You  will get an email in the next two days asking about your experience. I hope that your e-visit has been valuable and will speed your recovery.   Approximately 5 minutes was spent documenting and reviewing patient's chart.

## 2020-01-23 IMAGING — US US MFM OB FOLLOW-UP
1 series · 13 of 23 positions shown · non-contrast
Comparison: none

[Series 1: us mfm ob follow-up · 23 acquisitions, 13 frames shown]
[im 1/23]
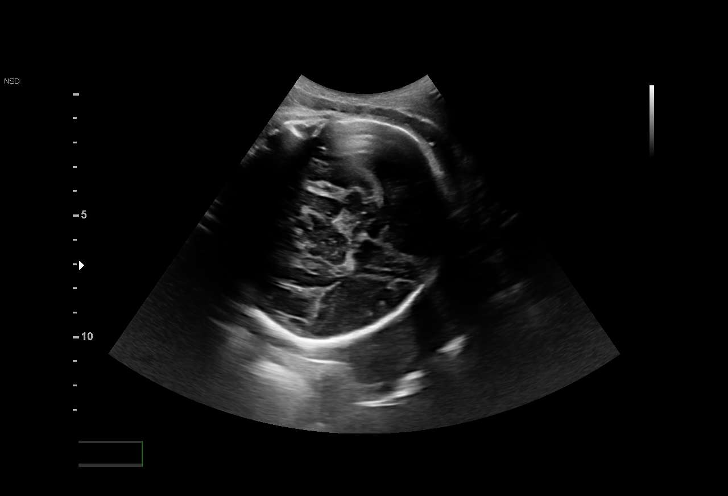
[im 3/23]
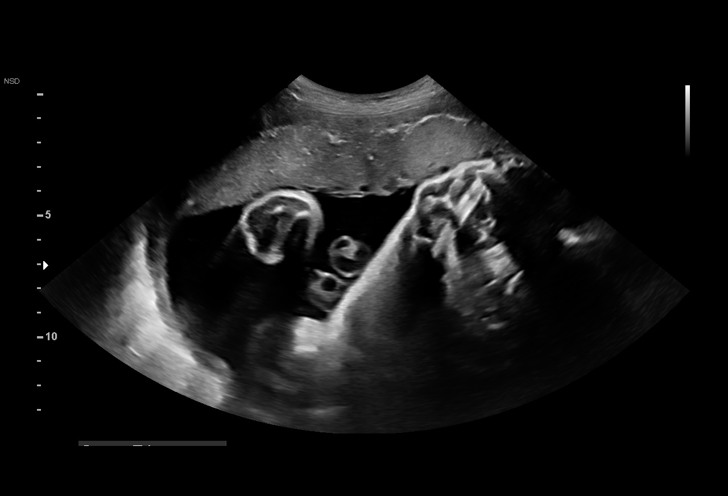
[im 5/23]
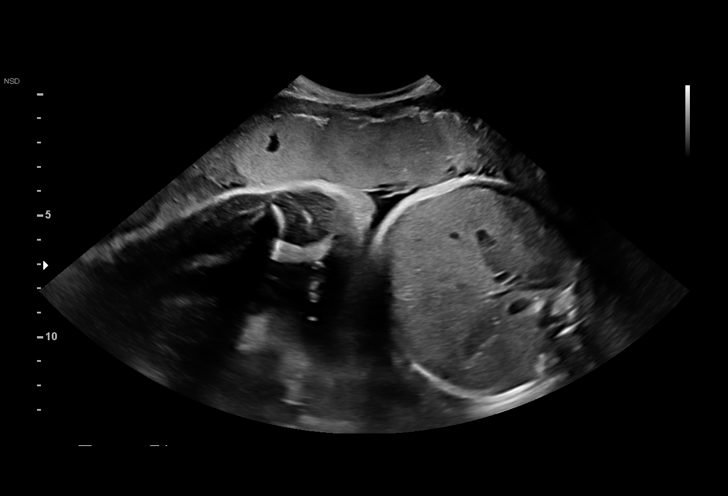
[im 7/23]
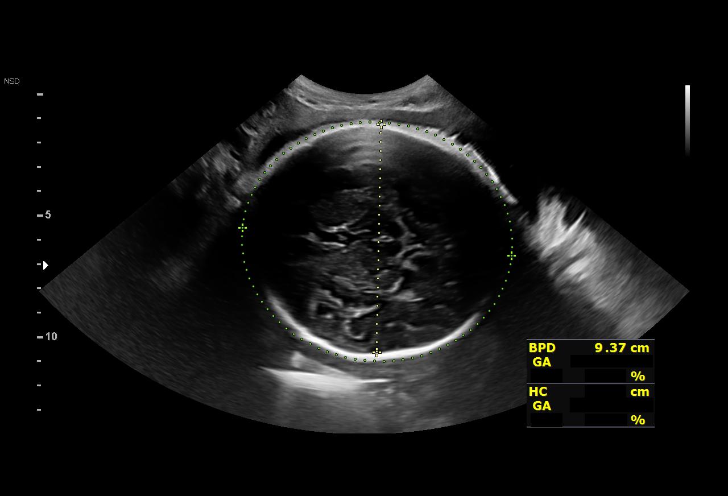
[im 8/23]
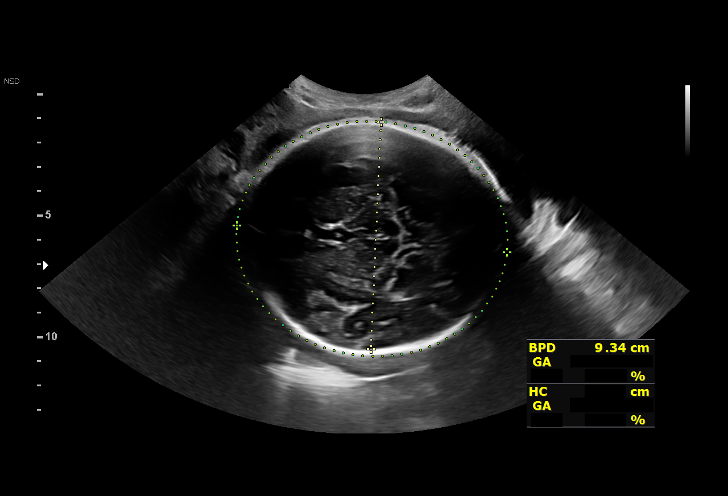
[im 10/23]
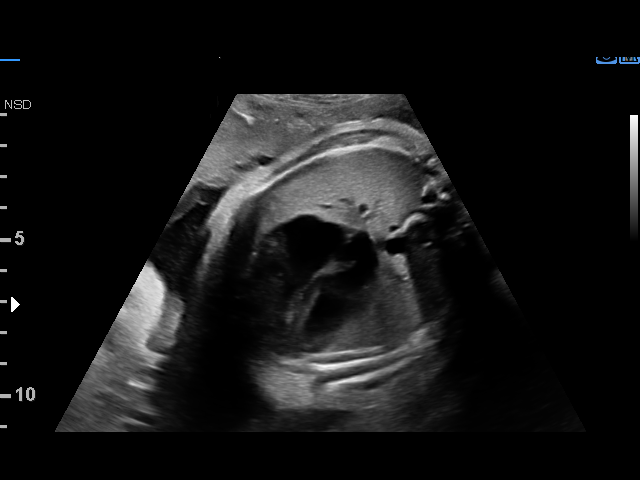
[im 12/23]
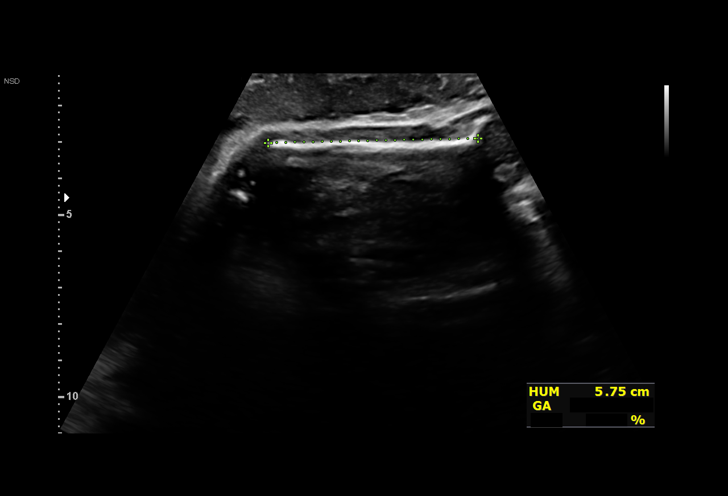
[im 14/23]
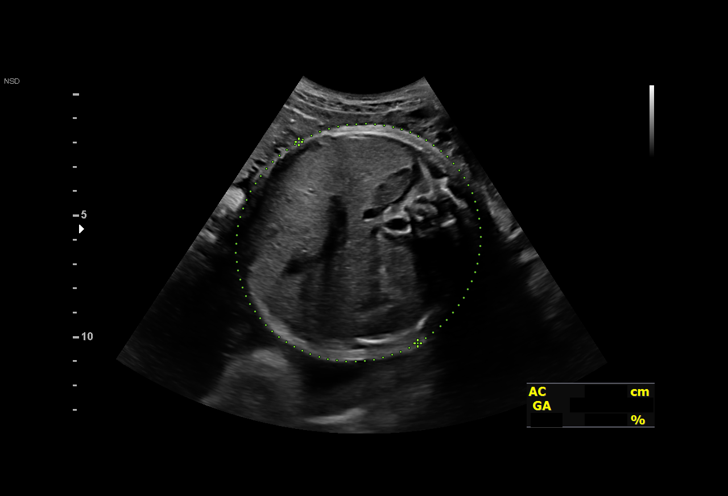
[im 16/23]
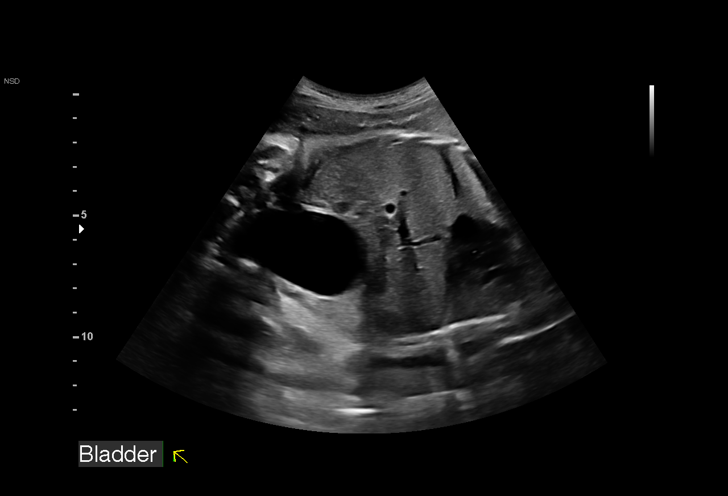
[im 17/23]
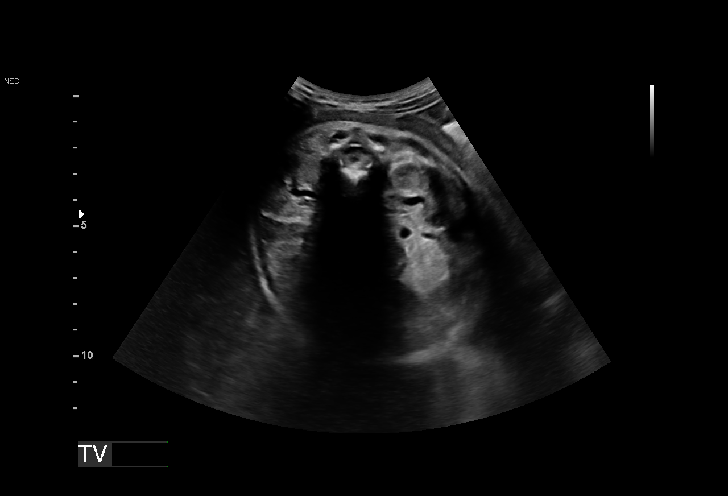
[im 19/23]
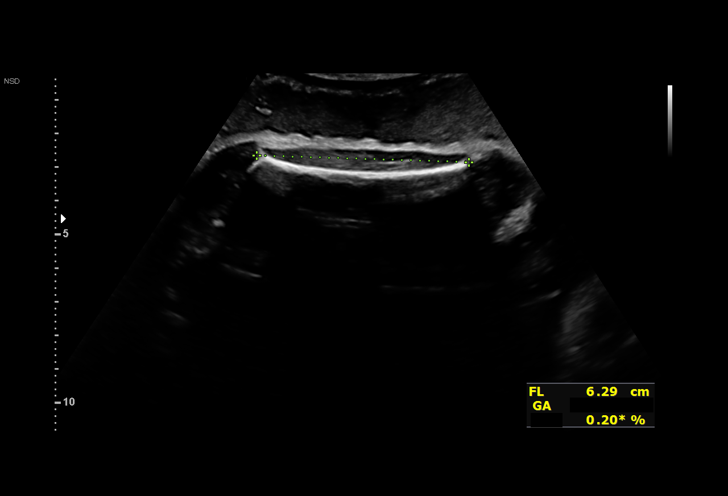
[im 21/23]
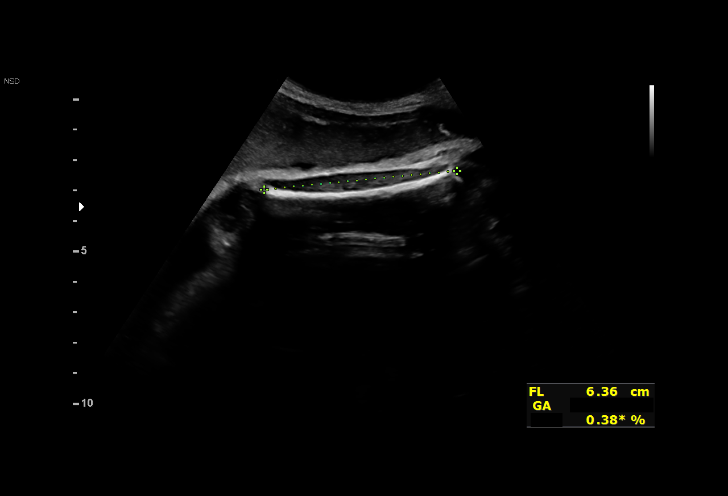
[im 23/23]
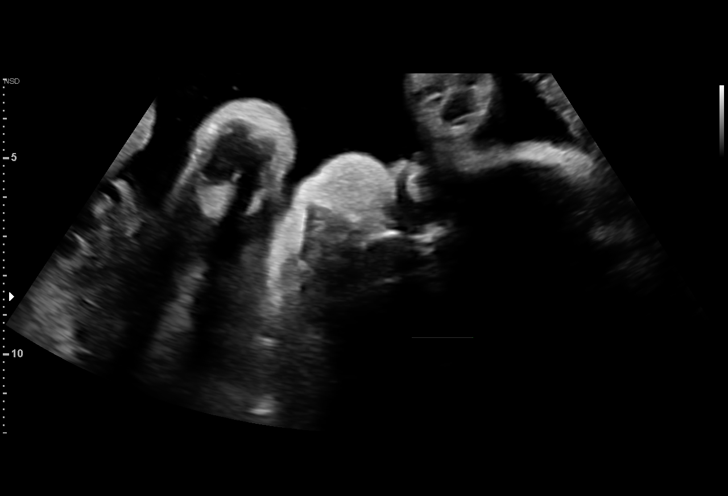

[13 of 23 positions shown; findings below may reference images not displayed]

----------------------------------------------------------------------

 ----------------------------------------------------------------------
Indications

  Marginal insertion of umbilical cord affecting
  management of mother in third trimester
  Encounter for other antenatal screening
  follow-up
  36 weeks gestation of pregnancy
 ----------------------------------------------------------------------
Fetal Evaluation

 Num Of Fetuses:         1
 Fetal Heart Rate(bpm):  123
 Cardiac Activity:       Observed
 Presentation:           Cephalic
 Placenta:               Anterior
 P. Cord Insertion:      Marginal insertion

 Amniotic Fluid
 AFI FV:      Within normal limits

 AFI Sum(cm)     %Tile       Largest Pocket(cm)
 16.53           62

 RUQ(cm)       RLQ(cm)       LUQ(cm)        LLQ(cm)

Biometry

 BPD:      93.5  mm     G. Age:  38w 0d         92  %    CI:        80.79   %    70 - 86
                                                         FL/HC:      19.2   %    20.8 -
 HC:      328.5  mm     G. Age:  37w 2d         40  %    HC/AC:      1.06        0.92 -
 AC:      310.1  mm     G. Age:  35w 0d         18  %    FL/BPD:     67.4   %    71 - 87
 FL:         63  mm     G. Age:  32w 4d        < 1  %    FL/AC:      20.3   %    20 - 24
 HUM:      57.3  mm     G. Age:  33w 2d          7  %
 Est. FW:    0288  gm      5 lb 9 oz     14  %
OB History

 Gravidity:    3         Term:   2        Prem:   0        SAB:   0
 TOP:          0       Ectopic:  0        Living: 2
Gestational Age

 LMP:           36w 4d        Date:  10/31/18                 EDD:   08/07/19
 U/S Today:     35w 5d                                        EDD:   08/13/19
 Best:          36w 4d     Det. By:  LMP  (10/31/18)          EDD:   08/07/19
Anatomy

 Cranium:               Appears normal         LVOT:                   Previously seen
 Cavum:                 Previously seen        Aortic Arch:            Previously seen
 Ventricles:            Previously seen        Ductal Arch:            Previously seen
 Choroid Plexus:        Previously seen        Diaphragm:              Appears normal
 Cerebellum:            Previously seen        Stomach:                Appears normal, left
                                                                       sided
 Posterior Fossa:       Previously seen        Abdomen:                Appears normal
 Nuchal Fold:           Not applicable (>20    Abdominal Wall:         Previously seen
                        wks GA)
 Face:                  Orbits and profile     Cord Vessels:           Previously seen
                        previously seen
 Lips:                  Previously seen        Kidneys:                Appear normal
 Palate:                Previously seen        Bladder:                Appears normal
 Thoracic:              Appears normal         Spine:                  Previously seen
 Heart:                 Appears normal         Upper Extremities:      Previously seen
                        (4CH, axis, and
                        situs)
 RVOT:                  Previously seen        Lower Extremities:      Previously seen

 Other:  Male gender previously seen. Heels previously seen. 5th digit
         previously seen. Nasal bone previously seen.
Cervix Uterus Adnexa

 Cervix
 Not visualized (advanced GA >30wks)
Comments

 This patient was seen for a follow up growth scan due to a
 marginal placental cord insertion that was noted during her
 prior exams.  She denies any problems since her last exam.
 The fetal growth remains in the lower normal range (14th
 percentile).  There was normal amniotic fluid noted.
 As the fetal growth is within normal limits, no further exams
 were scheduled.

## 2020-02-12 ENCOUNTER — Telehealth: Payer: Medicaid Other | Admitting: Emergency Medicine

## 2020-02-12 DIAGNOSIS — L609 Nail disorder, unspecified: Secondary | ICD-10-CM

## 2020-02-12 NOTE — Progress Notes (Signed)
Time spent: 10 min  Amy Decker,   I am sorry you are not feeling well.   Based on the pictures it looks like you have fungal infection of your nail.  This classically causes thickened, discolored finger nails.  It usually is painless without drainage.    Unfortunately, treatment of this is out of the scope of e-visit.  Thankfully, this condition is not life threatening.    There are some long term treatments like oral anti-fungal medications.  These medicines usually are prescribed for weeks or months.  Please contact your primary care doctor for further management. There is no great over the counter medicines that can help get rid of this infection.    Seek immediate care if you develop swelling, redness, warmth, pus drainage out of the skin around the nail.    Sharen Heck, PA-C [facility]

## 2020-02-13 ENCOUNTER — Telehealth: Payer: Medicaid Other | Admitting: Family

## 2020-02-13 DIAGNOSIS — S6010XA Contusion of unspecified finger with damage to nail, initial encounter: Secondary | ICD-10-CM

## 2020-02-13 NOTE — Progress Notes (Signed)
E visit for Simple Cut/Laceration  We are sorry that you have had an injury. Here is how we plan to help!  Given you have tore your nail. You need to soak the nail. Keep clean and dry. You wrap the area to avoid reinjury.   HOME CARE: . Clean the cut or scrape - Wash it well with soap and water. * avoid using hydrogen peroxide which may cause tissue damage, or impede wound healing.  . Stop the bleeding - If your cut or scrape is bleeding, press a clean cloth or bandage firmly on the area for 20 minutes. You can also help slow the bleeding by holding the cut above the level of your heart.   . Put a thin layer of Bacitracin antibiotic ointment on the cut or scrape. (this can be purchased at any local pharmacy- ask your pharmacist if you need assistance)   . Cover the cut or scrape with a bandage or gauze. Keep the bandage clean and dry. Change the bandage 1 to 2 times every day until your cut or scrape heals.   . Watch for signs that your cut or scrape is infected (redness, drainage, pain, warmth, swelling or fever)  Over the next 48 hours your wound should start to improve with less pain, less swelling and less redness. If you should develop increasing pain, swelling, redness, fever, pus from the wound you should be seen immediately to make sure this is not becoming infected.   WOUND CARE: Please keep a layer of antibiotic ointment (bacitracin preferred) on this wound at least twice a day for the next seven days and keep a sterile dressing over top of it. You may gently clean the wound with warm soap and water between dressing changes.  We strongly recommend that you have a medical provider reevaluate your wound within 2 to 3 days in person to make sure that it is healing appropriately.  Thank you for choosing an e-visit. Your e-visit answers were reviewed by a board certified advanced clinical practitioner to complete your personal care plan. Depending upon the condition, your plan could  have included both over the counter or prescription medications. Please review your pharmacy choice. Make sure the pharmacy is open so you can pick up prescription now. If there is a problem, you may contact your provider through [employer] and have the prescription routed to another pharmacy. Your safety is important to [place]. If you have drug allergies check your prescription carefully.  For the next 24 hours you can use MyChart to ask questions about today's visit, request a non-urgent call back, or ask for a work or school excuse.  You will get an email with a link to our survey asking about your experience. I hope that your e-visit has been valuable and will speed your recovery   Approximately 5 minutes was spent documenting and reviewing patient's chart.

## 2020-02-20 ENCOUNTER — Other Ambulatory Visit: Payer: Self-pay

## 2020-02-20 ENCOUNTER — Ambulatory Visit (HOSPITAL_COMMUNITY)
Admission: EM | Admit: 2020-02-20 | Discharge: 2020-02-20 | Disposition: A | Discharge status: Home / Self Care | Payer: Medicaid Other | Attending: Emergency Medicine | Admitting: Emergency Medicine

## 2020-02-20 ENCOUNTER — Telehealth: Payer: Medicaid Other | Admitting: Family

## 2020-02-20 ENCOUNTER — Encounter (HOSPITAL_COMMUNITY): Payer: Self-pay | Admitting: *Deleted

## 2020-02-20 DIAGNOSIS — R300 Dysuria: Secondary | ICD-10-CM

## 2020-02-20 DIAGNOSIS — Z3202 Encounter for pregnancy test, result negative: Secondary | ICD-10-CM | POA: Diagnosis not present

## 2020-02-20 DIAGNOSIS — R109 Unspecified abdominal pain: Secondary | ICD-10-CM

## 2020-02-20 DIAGNOSIS — N898 Other specified noninflammatory disorders of vagina: Secondary | ICD-10-CM

## 2020-02-20 DIAGNOSIS — R399 Unspecified symptoms and signs involving the genitourinary system: Secondary | ICD-10-CM

## 2020-02-20 DIAGNOSIS — N390 Urinary tract infection, site not specified: Secondary | ICD-10-CM | POA: Diagnosis present

## 2020-02-20 LAB — POCT URINALYSIS DIPSTICK, ED / UC
Bilirubin Urine: NEGATIVE
Glucose, UA: NEGATIVE mg/dL
Ketones, ur: NEGATIVE mg/dL
Nitrite: NEGATIVE
Protein, ur: 100 mg/dL — AB
Specific Gravity, Urine: 1.015 (ref 1.005–1.030)
Urobilinogen, UA: 0.2 mg/dL (ref 0.0–1.0)
pH: 7.5 (ref 5.0–8.0)

## 2020-02-20 LAB — POC URINE PREG, ED: Preg Test, Ur: NEGATIVE

## 2020-02-20 MED ORDER — NAPROXEN 500 MG PO TABS: 500.0000 mg | ORAL_TABLET | Freq: Two times a day (BID) | ORAL | 0 refills | Status: DC

## 2020-02-20 MED ORDER — ONDANSETRON 4 MG PO TBDP: 4.0000 mg | ORAL_TABLET | Freq: Three times a day (TID) | ORAL | 0 refills | Status: DC | PRN

## 2020-02-20 MED ORDER — CIPROFLOXACIN HCL 500 MG PO TABS: 500.0000 mg | ORAL_TABLET | Freq: Two times a day (BID) | ORAL | 0 refills | Status: AC

## 2020-02-20 NOTE — Progress Notes (Signed)
Based on what you shared with me, I feel your condition warrants further evaluation and I recommend that you be seen for a face to face office visit.  Give you are having UTI symptoms with vaginal discharge and back pain you need to be seen face to face to rule out a more serious infection.     NOTE: If you entered your credit card information for this eVisit, you will not be charged. You may see a "hold" on your card for the $35 but that hold will drop off and you will not have a charge processed.   If you are having a true medical emergency please call 911.      For an urgent face to face visit, [place] has five urgent care centers for your convenience:      NEW:  [facility] at [facility] Directions [phone] [address] [place], [place] [address] . 10 am - 6pm Monday - Friday    [facility]) Get Driving Directions [phone] [address] [place], [place] [address] . 10 am to 8 pm Monday-Friday . 12 pm to 8 pm [facility] at [facility] Get Driving Directions [phone] [address] [place], [place] [address] . 8 am to 8 pm Monday-Friday . 9 am to 6 pm Saturday . 11 am to 6 pm Sunday     [facility] at [facility] Get Driving Directions  [phone] [address] [place], [place] [address] . 8 am to 8 pm Monday-Friday . 8 am to 4 pm [facility] Urgent Care at [facility] Directions [phone] [address] [place], [place] [address] . 12 pm to 6 pm Monday-Friday      Your e-visit answers were reviewed by a board certified advanced clinical practitioner to complete your personal care plan.  Thank you for using e-Visits.

## 2020-02-20 NOTE — ED Triage Notes (Signed)
Reports dysuria x 2 days; now having low abd pain and bilat flank pain; describes general malaise.  Unsure if fevers.

## 2020-02-20 NOTE — Discharge Instructions (Addendum)
Urine showed evidence of infection. We are treating you with cipro twice dialy for 1 week. Be sure to take full course. Stay hydrated- urine should be pale yellow to clear.  Zofran for nausea as needed Naprosyn twice daily with food for pain  Please return or follow up with your primary provider if symptoms not improving with treatment. Please return sooner if you have worsening of symptoms or develop fever, nausea, vomiting, abdominal pain, back pain, lightheadedness, dizziness.

## 2020-02-21 NOTE — ED Provider Notes (Signed)
[facility]    CSN: [id number] Arrival date & time: 02/20/20  1511      History   Chief Complaint Chief Complaint  Patient presents with  . Dysuria  . Abdominal Pain    HPI Amy Decker is a 29 y.o. female presenting today for evaluation of UTI.  Patient reports over the past few days she has had dysuria, urinary frequency urgency and incomplete voiding.  Over the past 24 hours she has developed lower abdominal pain nausea and generalized malaise.  She has had a hot and cold chills.  Denies known fevers.  Does report prior history of UTIs, but has not had in a while.  Last menstrual cycle ended approximately 1 week ago.  Is not on birth control.  Plans to restart Depo-Provera.  Denies vaginal or pelvic symptoms  HPI  Past Medical History:  Diagnosis Date  . Peripheral vascular disease [facility])     Patient Active Problem List   Diagnosis Date Noted  . Genetic testing 08/06/2019  . Premature rupture of membranes 08/05/2019  . Marginal insertion of umbilical cord affecting management of mother 05/07/2019  . Late prenatal care affecting pregnancy 04/06/2019  . Supervision of other normal pregnancy, antepartum 03/30/2019    Past Surgical History:  Procedure Laterality Date  . CHOLECYSTECTOMY    . TONSILLECTOMY      OB History    Gravida  3   Para  3   Term  3   Preterm      AB      Living  3     SAB      TAB      Ectopic      Multiple  0   Live Births  3            Home Medications    Prior to Admission medications   Medication Sig Start Date End Date Taking? Authorizing Provider  escitalopram (LEXAPRO) 10 MG tablet Take 1 tablet (10 mg total) by mouth daily. 10/22/19  Yes Bing Neighbors, FNP  omeprazole (PRILOSEC) 20 MG capsule Take 20 mg by mouth daily.   Yes [provider]  ciprofloxacin (CIPRO) 500 MG tablet Take 1 tablet (500 mg total) by mouth every 12 (twelve) hours for 7 days. 02/20/20 02/27/20  Shemica Meath,  Kenadee Gates C, PA-C  meloxicam (MOBIC) 7.5 MG tablet Take 1 tablet (7.5 mg total) by mouth 2 (two) times daily as needed for pain. 12/31/19   Wallis Bamberg, PA-C  naproxen (NAPROSYN) 500 MG tablet Take 1 tablet (500 mg total) by mouth 2 (two) times daily. 02/20/20   Yaqueline Gutter C, PA-C  ondansetron (ZOFRAN ODT) 4 MG disintegrating tablet Take 1 tablet (4 mg total) by mouth every 8 (eight) hours as needed for nausea or vomiting. 02/20/20   Kaimana Neuzil, Junius Creamer, PA-C    Family History Family History  Problem Relation Age of Onset  . Arthritis Mother   . Cancer Mother   . Hypertension Maternal Grandfather     Social History Social History   Tobacco Use  . Smoking status: Former Smoker    Types: Cigarettes  . Smokeless tobacco: Never Used  Vaping Use  . Vaping Use: Every day  Substance Use Topics  . Alcohol use: Never  . Drug use: Never     Allergies   Patient has no known allergies.   Review of Systems Review of Systems  Constitutional: Positive for chills. Negative for fever.  Respiratory: Negative for shortness  of breath.   Cardiovascular: Negative for chest pain.  Gastrointestinal: Positive for abdominal pain and nausea. Negative for diarrhea and vomiting.  Genitourinary: Positive for dysuria. Negative for flank pain, genital sores, hematuria, menstrual problem, vaginal bleeding, vaginal discharge and vaginal pain.  Musculoskeletal: Negative for back pain.  Skin: Negative for rash.  Neurological: Negative for dizziness, light-headedness and headaches.     Physical Exam Triage Vital Signs ED Triage Vitals [02/20/20 1620]  Enc Vitals Group     BP (!) 97/51     Pulse Rate 63     Resp 16     Temp 98.5 F (36.9 C)     Temp Source Oral     SpO2 100 %     Weight      Height      Head Circumference      Peak Flow      Pain Score 10     Pain Loc      Pain Edu?      Excl. in GC?    No data found.  Updated Vital Signs BP (!) 97/51   Pulse 63   Temp 98.5 F (36.9 C)  (Oral)   Resp 16   LMP 02/14/2020 (Approximate)   SpO2 100%   Breastfeeding No   Visual Acuity Right Eye Distance:   Left Eye Distance:   Bilateral Distance:    Right Eye Near:   Left Eye Near:    Bilateral Near:     Physical Exam Vitals and nursing note reviewed.  Constitutional:      Appearance: She is well-developed.     Comments: No acute distress  HENT:     Head: Normocephalic and atraumatic.     Nose: Nose normal.  Eyes:     Conjunctiva/sclera: Conjunctivae normal.  Cardiovascular:     Rate and Rhythm: Normal rate.  Pulmonary:     Effort: Pulmonary effort is normal. No respiratory distress.  Abdominal:     General: There is no distension.     Comments: Soft, nondistended, tender to palpation to bilateral lower quadrants, more focal over suprapubic area  Musculoskeletal:        General: Normal range of motion.     Cervical back: Neck supple.  Skin:    General: Skin is warm and dry.  Neurological:     Mental Status: She is alert and oriented to person, place, and time.      UC Treatments / Results  Labs (all labs ordered are listed, but only abnormal results are displayed) Labs Reviewed  POCT URINALYSIS DIPSTICK, ED / UC - Abnormal; Notable for the following components:      Result Value   Hgb urine dipstick MODERATE (*)    Protein, ur 100 (*)    Leukocytes,Ua SMALL (*)    All other components within normal limits  URINE CULTURE  POC URINE PREG, ED    EKG   Radiology No results found.  Procedures Procedures (including critical care time)  Medications Ordered in UC Medications - No data to display  Initial Impression / Assessment and Plan / UC Course  I have reviewed the triage vital signs and the nursing notes.  Pertinent labs & imaging results that were available during my care of the patient were reviewed by me and considered in my medical decision making (see chart for details).     Pregnancy test negative, UA consistent with UTI.   Treating with Cipro to cover Pyelo given associated systemic symptoms of  nausea, flank pain and chills.  Vital signs stable.  Urine culture pending.  Will alter therapy as needed.  Discussed strict return precautions. Patient verbalized understanding and is agreeable with plan.  Final Clinical Impressions(s) / UC Diagnoses   Final diagnoses:  Lower urinary tract infectious disease     Discharge Instructions     Urine showed evidence of infection. We are treating you with cipro twice dialy for 1 week. Be sure to take full course. Stay hydrated- urine should be pale yellow to clear.  Zofran for nausea as needed Naprosyn twice daily with food for pain  Please return or follow up with your primary provider if symptoms not improving with treatment. Please return sooner if you have worsening of symptoms or develop fever, nausea, vomiting, abdominal pain, back pain, lightheadedness, dizziness.   ED Prescriptions    Medication Sig Dispense Auth. Provider   ciprofloxacin (CIPRO) 500 MG tablet Take 1 tablet (500 mg total) by mouth every 12 (twelve) hours for 7 days. 14 tablet Kiran Carline C, PA-C   ondansetron (ZOFRAN ODT) 4 MG disintegrating tablet Take 1 tablet (4 mg total) by mouth every 8 (eight) hours as needed for nausea or vomiting. 20 tablet Wanna Gully C, PA-C   naproxen (NAPROSYN) 500 MG tablet Take 1 tablet (500 mg total) by mouth 2 (two) times daily. 30 tablet Danity Schmelzer, [place] C, PA-C     PDMP not reviewed this encounter.   Lew Dawes, [place] 02/21/20 1431

## 2020-02-22 ENCOUNTER — Telehealth: Payer: Medicaid Other | Admitting: Physician Assistant

## 2020-02-22 DIAGNOSIS — K121 Other forms of stomatitis: Secondary | ICD-10-CM

## 2020-02-22 LAB — URINE CULTURE: Culture: 80000 — AB

## 2020-02-22 NOTE — Progress Notes (Signed)
E-Visit for Mouth Ulcers  We are sorry that you are not feeling well.  Here is how we plan to help!  Based on what you have shared with me, it appears that you do have mouth ulcer(s).     The following medications should decrease the discomfort and help with healing. Biotene mouthwash 3 times daily (Available over the counter) and Orajel (Available over the counter)   Mouth ulcers are painful areas in the mouth and gums. These are also known as "canker sores".  They can occur anywhere inside the mouth. While mostly harmless, mouth ulcers can be extremely uncomfortable and may make it difficult to eat, drink, and brush your teeth.  You may have more than 1 ulcer and they can vary and change in size. Mouth ulcers are not contagious and should not be confused with cold sores.  Cold sores appear on the lip or around the outside of the mouth and often begin with a tingling, burning or itching sensation.   While the exact causes are unknown, some common causes and factors that may aggravate mouth ulcers include: . Genetics - Sometimes mouth ulcers run in families . High alcohol intake . Acidic foods such as citrus fruits like pineapple, grapefruit, orange fruits/juices, may aggravate mouth ulcers . Other foods high in acidity or spice such as coffee, chocolate, chips, pretzels, eggs, nuts, cheese . Quitting smoking . Injury caused by biting the tongue or inside of the cheek . Diet lacking in B-12, zinc, folic acid or iron . Female hormone shifts with menstruation . Excessive fatigue, emotional stress or anxiety Prevention: . Talk to your doctor if you are taking meds that are known to cause mouth ulcers such as:   Anti-inflammatory drugs (for example Ibuprofen, Naproxen sodium), pain killers, Beta blockers, Oral nicotine replacement drugs, Some street drugs (heroin).   . Avoid allowing any tablets to dissolve in your mouth that are meant to swallowed whole . Avoid foods/drinks that trigger or  worsen symptoms . Keep your mouth clean with daily brushing and flossing  Home Care: . The goal with treatment is to ease the pain where ulcers occur and help them heal as quickly as possible.  There is no medical treatment to prevent mouth ulcers from coming back or recurring.  . Avoid spicy and acidic foods . Eat soft foods and avoid rough, crunchy foods . Avoid chewing gum . Do not use toothpaste that contains sodium lauryl sulphite . Use a straw to drink which helps avoid liquids toughing the ulcers near the front of your mouth . Use a very soft toothbrush . If you have dentures or dental hardware that you feel is not fitting well or contributing to his, please see your dentist. . Use saltwater mouthwash which helps healing. Dissolve a  teaspoon of salt in a glass of warm water. Swish around your mouth and spit it out. This can be used as needed if it is soothing.   GET HELP RIGHT AWAY IF: . Persistent ulcers require checking IN PERSON (face to face). Any mouth lesion lasting longer than a month should be seen by your DENTIST as soon as possible for evaluation for possible oral cancer. . If you have a non-painful ulcer in 1 or more areas of your mouth . Ulcers that are spreading, are very large or particularly painful . Ulcers last longer than one week without improving on treatment . If you develop a fever, swollen glands and begin to feel unwell . Ulcers that developed  after starting a new medication MAKE SURE YOU:  Understand these instructions.  Will watch your condition.  Will get help right away if you are not doing well or get worse.  Your e-visit answers were reviewed by a board certified advanced clinical practitioner to complete your personal care plan.  Depending upon the condition, your plan could have included both over the counter or prescription medications.    Please review your pharmacy choice.  Be sure that the pharmacy you have chosen is open so that you can  pick up your prescription now.  If there is a problem, you can message your provider in MyChart to have the prescription routed to another pharmacy.    Your safety is important to [place].  If you have drug allergies check our prescription carefully.  For the next 24 hours you can use MyChart to ask questions about today's visit, request a non-urgent call back, or ask for a work or school excuse from your e-visit provider.  You will get an email with a survey asking about your experience and to give [place] any feedback.  I hope that your e-visit has been valuable and will speed your recovery.  Approximately 5 minutes was spent documenting and reviewing patient's chart.

## 2020-03-01 ENCOUNTER — Telehealth: Payer: Medicaid Other | Admitting: Family

## 2020-03-01 DIAGNOSIS — M79644 Pain in right finger(s): Secondary | ICD-10-CM | POA: Diagnosis not present

## 2020-03-01 MED ORDER — SILVER SULFADIAZINE 1 % EX CREA: 1.0000 "application " | TOPICAL_CREAM | Freq: Every day | CUTANEOUS | 0 refills | Status: DC

## 2020-03-01 NOTE — Progress Notes (Signed)
E-VISIT for Burn  We are sorry that you are not feeling well. Here is how we plan to help!  Based on what you have shared with me it looks like you may have: 1st degree burn - usually peels like a sunburn in about a week.  The skin should look nearly normal after 2 weeks.    Based on your assessment:  I have prescribed Silvadene 1% cream.  Apply with gloves to affected area 1-2 times a day.  Apply a non-stick dressing such as Telfa to the site after you apply the cream.  You may hold the dressing in place with either paper tape or self adhering wrap such as Coban.  Product similar to Telfa and Coban can be bought at any pharmacy.  If you have a question ask your pharmacist.  Burns are a type of painful wound caused by thermal, electrical, chemical, or electromagnetic energy.  Smoking and open flame are the leading cause of burn injury for older adults.  Scalding from a hot liquid is the leading cause of burn injury for children.  Both infants and older adults are the greatest risk for burn injury.  First degree burns effect only the outer layers of the skin.  The burn may be red and painful but the skin does not blister.  Long term tissue damage is rare.  Second degree burns involve the surface of the skin and the adjacent skin layers.  The burn sire also appears red and painful and the skin often swells and/or blisters.  Third degree burns destroy both layers of the skin and can also penetrate to underlying  Structures.  A third degree burn may not initially hurt because nerve endings were destroyed.  All third degree burns should be evaluated in person.  HOME CARE:   Wash the area gently with soap and water once a day  Apply antibiotic ointment directly to a Band-Aid or dressing and apply Band-Aid or dressing over the burn.  Change dressing every other day.  Use warm water and 1 or 2 wipes with a wet washcloth to remove any surface debris.  Some of the newer antibiotic ointments contain  lidocaine that can help to control the localized pain of the burn.  You should leave intact blisters alone for the first 7 days.  After a week you may gently remove blisters.  The easiest way to do this is gently wipe away the dead skin with wet gauze or wet washcloth.  If that fails you may carefully trim off the dead skin with a pair of fine scissors.  Be sure to clean the scissors in alcohol before use.  GET HELP RIGHT AWAY IF:   The area of the burn is larger than 4 palms of our hand.  You become short of breath.  The site looks infected.  Your symptoms persist after you have completed your treatment plan.  The burn has not healed in 14 days.    MAKE SURE YOU:   Understand these instructions.  Will watch your condition.  Will get help right away if you are not doing well or get worse.  Your e-visit answers were reviewed by a board certified advanced clinical practitioner to complete your personal care plan.  Depending upon the condition, your plan could have included both over the counter or prescription medications.    Please review your pharmacy choice.  Make sure the pharmacy is open so you can pick up prescription now.   If there is   a problem, you may contact your provider through [employer] and have the prescription routed to another pharmacy. Your safety is important to [place].  If you have drug allergies check your prescription carefully.    For the next 24 hours you can use MyChart to ask questions about today's visit, request a non-urgent call back, or ask for a work or school excuse.  You will get an email in the next 2 days asking about your experience.  I hope that your e-visit has been valuable and will speed your recovery.  If you need an urgent face to face visit, [facility] has four urgent care centers for your convenience.  [facility]  [phone] Get Driving Directions Find a Provider at this Location  [address] [place], [place] [address] . 8 am to 8 pm Monday-Friday . 9 am to 7 pm Saturday-Sunday  . [facility]  [phone] Get Driving Directions Find a Provider at this Location  [address], [address] [place], [place] [address] . 8 am to 8 pm Monday-Friday . 9 am to 6 pm Saturday . 11 am to 6 pm Sunday   . [facility] at [facility]  [phone] Get Driving Directions  [address] [place], [place] [address] . 8 am to 8 pm Monday-Friday . 9 am to 4 pm Saturday-Sunday   . [facility] (a walk in primary care provider)  [phone]  Get Driving Directions Find a Provider at this Location  [address] [place], [place] [address] . 8 am to 8:30 pm Monday-Thursday . 8 am to 6 pm Friday . 8 am to 4 pm Saturday-Sunday    Approximately 5 minutes was spent documenting and reviewing patient's chart.

## 2020-03-04 ENCOUNTER — Telehealth: Payer: Medicaid Other | Admitting: Family

## 2020-03-04 DIAGNOSIS — H579 Unspecified disorder of eye and adnexa: Secondary | ICD-10-CM

## 2020-03-04 NOTE — Progress Notes (Signed)
Based on what you shared with me, I feel your condition warrants further evaluation and I recommend that you be seen for a face to face visit.  Please contact your primary care physician practice to be seen. Many offices offer virtual options to be seen via video if you are not comfortable going in person to a medical facility at this time.  If you do not have a PCP, [facility] offers a free physician referral service available at [phone]. Our trained staff has the experience, knowledge and resources to put you in touch with a physician who is right for you.   You also have the option of a video visit through [email or web address]  If you are having a true medical emergency please call 911.  NOTE: If you entered your credit card information for this eVisit, you will not be charged. You may see a "hold" on your card for the $35 but that hold will drop off and you will not have a charge processed.  Your e-visit answers were reviewed by a board certified advanced clinical practitioner to complete your personal care plan.  Thank you for using e-Visits.  

## 2020-04-19 ENCOUNTER — Ambulatory Visit: Payer: Medicaid Other | Admitting: Internal Medicine

## 2020-04-24 ENCOUNTER — Telehealth: Payer: Self-pay | Admitting: Internal Medicine

## 2020-04-24 NOTE — Telephone Encounter (Signed)
Patient called saying that she is taking escitalopram (LEXAPRO) 10 MG tablet but the medication is not helping her anymore.Patient would like to know if the dosage could be increased. Please f/u

## 2020-04-26 NOTE — Telephone Encounter (Signed)
Please r/s no-showed appointment.

## 2020-05-12 ENCOUNTER — Other Ambulatory Visit: Payer: Self-pay

## 2020-05-12 ENCOUNTER — Ambulatory Visit (INDEPENDENT_AMBULATORY_CARE_PROVIDER_SITE_OTHER): Payer: Medicaid Other | Admitting: Internal Medicine

## 2020-05-12 ENCOUNTER — Encounter: Payer: Self-pay | Admitting: Internal Medicine

## 2020-05-12 VITALS — BP 107/67 | HR 66 | Temp 97.3°F | Resp 17 | Wt 110.0 lb

## 2020-05-12 DIAGNOSIS — F325 Major depressive disorder, single episode, in full remission: Secondary | ICD-10-CM | POA: Diagnosis not present

## 2020-05-12 DIAGNOSIS — F411 Generalized anxiety disorder: Secondary | ICD-10-CM | POA: Diagnosis not present

## 2020-05-12 DIAGNOSIS — Z1159 Encounter for screening for other viral diseases: Secondary | ICD-10-CM

## 2020-05-12 MED ORDER — ESCITALOPRAM OXALATE 20 MG PO TABS: 10.0000 mg | ORAL_TABLET | Freq: Every day | ORAL | 1 refills | Status: DC

## 2020-05-12 NOTE — Progress Notes (Signed)
Subjective:    Royanne Warshaw - 29 y.o. female MRN [id number]  Date of birth: [birth year]  HPI  Tekesha Almgren is here for follow up on anxiety and depression. She takes Lexapro 10 mg. Wonders if dose is strong enough. Starting to feel more anxious and irritable. Has trouble with coping. Does notice a decreased appetite recently. No SI.    Depression screen [facility] 2/9 05/12/2020 10/22/2019 09/06/2019  Decreased Interest 1 0 2  Down, Depressed, Hopeless 2 0 1  PHQ - 2 Score 3 0 3  Altered sleeping 2 - 2  Tired, decreased energy 3 - 1  Change in appetite 3 - 1  Feeling bad or failure about yourself  0 - 1  Trouble concentrating 1 - 0  Moving slowly or fidgety/restless 1 - 0  Suicidal thoughts 0 - 0  PHQ-9 Score 13 - 8   GAD 7 : Generalized Anxiety Score 05/12/2020 10/22/2019  Nervous, Anxious, on Edge 2 1  Control/stop worrying 2 0  Worry too much - different things 3 0  Trouble relaxing 2 1  Restless 0 0  Easily annoyed or irritable 3 0  Afraid - awful might happen 1 -  Total GAD 7 Score 13 -      Health Maintenance:  Health Maintenance Due  Topic Date Due  . Hepatitis C Screening  Never done  . COVID-19 Vaccine (1) Never done  . INFLUENZA VACCINE  Never done    -  reports that she has quit smoking. Her smoking use included cigarettes. She has never used smokeless tobacco. - Review of Systems: Per HPI. - Past Medical History: There are no problems to display for this patient.  - Medications: reviewed and updated   Objective:   Physical Exam BP 107/67   Pulse 66   Temp (!) 97.3 F (36.3 C) (Temporal)   Resp 17   Wt 110 lb (49.9 kg)   SpO2 97%   BMI 18.30 kg/m  Physical Exam Constitutional:      General: She is not in acute distress.    Appearance: She is not diaphoretic.  Cardiovascular:     Rate and Rhythm: Normal rate.  Pulmonary:     Effort: Pulmonary effort is normal. No respiratory distress.  Musculoskeletal:        General: Normal range  of motion.  Skin:    General: Skin is warm and dry.  Neurological:     Mental Status: She is alert and oriented to person, place, and time.  Psychiatric:        Mood and Affect: Affect normal.        Judgment: Judgment normal.        Assessment & Plan:   1. Major depressive disorder in full remission, unspecified whether recurrent (HCC) PHQ-9 score of 13, showing moderate depression. Will increase Lexapro dose. Discussed potential side effects and time course anticipated for desired efficacy of dose change. No SI/safety concerns.  - escitalopram (LEXAPRO) 20 MG tablet; Take 0.5 tablets (10 mg total) by mouth daily.  Dispense: 90 tablet; Refill: 1  2. Generalized anxiety disorder GAD-7 score of 13, representing moderate anxiety. Increase Lexpao dose. Discussed that side effect of decreased appetite is listed as 3% on UTD. May be that appetite is suppressed related to anxiety itself. Will monitor with change in dose.  - escitalopram (LEXAPRO) 20 MG tablet; Take 0.5 tablets (10 mg total) by mouth daily.  Dispense: 90 tablet; Refill: 1  [name], D.O. 05/12/2020, 9:38 AM Primary Care at [facility]

## 2020-05-15 ENCOUNTER — Telehealth: Payer: Medicaid Other | Admitting: Family

## 2020-05-15 DIAGNOSIS — M5442 Lumbago with sciatica, left side: Secondary | ICD-10-CM

## 2020-05-15 DIAGNOSIS — M5441 Lumbago with sciatica, right side: Secondary | ICD-10-CM

## 2020-05-15 MED ORDER — BACLOFEN 10 MG PO TABS: 10.0000 mg | ORAL_TABLET | Freq: Three times a day (TID) | ORAL | 0 refills | Status: DC

## 2020-05-15 MED ORDER — NAPROXEN 500 MG PO TABS: 500.0000 mg | ORAL_TABLET | Freq: Two times a day (BID) | ORAL | 0 refills | Status: DC

## 2020-05-15 NOTE — Progress Notes (Signed)

## 2020-05-22 ENCOUNTER — Telehealth: Payer: Medicaid Other | Admitting: Family

## 2020-05-22 ENCOUNTER — Other Ambulatory Visit: Payer: Self-pay | Admitting: Family

## 2020-05-22 DIAGNOSIS — H1013 Acute atopic conjunctivitis, bilateral: Secondary | ICD-10-CM

## 2020-05-22 MED ORDER — CETIRIZINE HCL 10 MG PO TABS: 10.0000 mg | ORAL_TABLET | Freq: Every day | ORAL | 11 refills | Status: DC

## 2020-05-22 NOTE — Progress Notes (Signed)
E-Visit for [employer]   We are sorry that you are not feeling well.  Here is how we plan to help!  Based on what you have shared with me it looks like you have conjunctivitis.  Conjunctivitis is a common inflammatory or infectious condition of the eye that is often referred to as "pink eye".  In most cases it is contagious (viral or bacterial). However, not all conjunctivitis requires antibiotics (ex. Allergic).  We have made appropriate suggestions for you based upon your presentation.  I recommend that you use OpconA, 1-2 drops every 4-6 hours (an over the counter allergy drop available at your local pharmacy).  Your pharmacist may have an alternative suggestion.  It sounds like you had an allergic reaction to your eye lashes. I recommend starting daily zyrtec 10 mg also.    Home Care:  Wash your hands often!  Do not wear your contacts until you complete your treatment plan.  Avoid sharing towels, bed linen, personal items with a person who has pink eye.  See attention for anyone in your home with similar symptoms.  Get Help Right Away If:  Your symptoms do not improve.  You develop blurred or loss of vision.  Your symptoms worsen (increased discharge, pain or redness)  Your e-visit answers were reviewed by a board certified advanced clinical practitioner to complete your personal care plan.  Depending on the condition, your plan could have included both over the counter or prescription medications.  If there is a problem please reply  once you have received a response from your provider.  Your safety is important to [place].  If you have drug allergies check your prescription carefully.    You can use MyChart to ask questions about today's visit, request a non-urgent call back, or ask for a work or school excuse for 24 hours related to this e-Visit. If it has been greater than 24 hours you will need to follow up with your provider, or enter a new e-Visit to address those  concerns.   You will get an e-mail in the next two days asking about your experience.  I hope that your e-visit has been valuable and will speed your recovery. Thank you for using e-visits.    Approximately 5 minutes was spent documenting and reviewing patient's chart.

## 2020-07-11 ENCOUNTER — Telehealth: Payer: Self-pay

## 2020-07-11 NOTE — Telephone Encounter (Signed)
ATC pt to confirm appt for tomorrow and to offer to switch to virtual, no answer, LM

## 2020-07-12 ENCOUNTER — Ambulatory Visit: Payer: Medicaid Other | Admitting: Internal Medicine

## 2020-07-24 ENCOUNTER — Telehealth: Payer: Medicaid Other | Admitting: Emergency Medicine

## 2020-07-24 DIAGNOSIS — R059 Cough, unspecified: Secondary | ICD-10-CM | POA: Diagnosis not present

## 2020-07-24 MED ORDER — BENZONATATE 100 MG PO CAPS: 100.0000 mg | ORAL_CAPSULE | Freq: Two times a day (BID) | ORAL | 0 refills | Status: DC | PRN

## 2020-07-24 MED ORDER — AZITHROMYCIN 250 MG PO TABS: ORAL_TABLET | ORAL | 0 refills | Status: DC

## 2020-07-24 NOTE — Progress Notes (Signed)
We are sorry that you are not feeling well.  Here is how we plan to help!  Based on your presentation I believe you most likely have A cough due to bacteria.  When patients have a fever and a productive cough with a change in color or increased sputum production, we are concerned about bacterial bronchitis.  If left untreated it can progress to pneumonia.  If your symptoms do not improve with your treatment plan it is important that you contact your provider.   I have prescribed Azithromyin 250 mg: two tablets now and then one tablet daily for 4 additonal days    In addition you may use A prescription cough medication called Tessalon Perles 100mg . You may take 1-2 capsules every 8 hours as needed for your cough.  Continue taking Tylenol and Motrin for pain.  It might take a couple of days for the antibiotic to kick in.  From your responses in the eVisit questionnaire you describe inflammation in the upper respiratory tract which is causing a significant cough.  This is commonly called Bronchitis and has four common causes:    Allergies  Viral Infections  Acid Reflux  Bacterial Infection Allergies, viruses and acid reflux are treated by controlling symptoms or eliminating the cause. An example might be a cough caused by taking certain blood pressure medications. You stop the cough by changing the medication. Another example might be a cough caused by acid reflux. Controlling the reflux helps control the cough.  USE OF BRONCHODILATOR ("RESCUE") INHALERS: There is a risk from using your bronchodilator too frequently.  The risk is that over-reliance on a medication which only relaxes the muscles surrounding the breathing tubes can reduce the effectiveness of medications prescribed to reduce swelling and congestion of the tubes themselves.  Although you feel brief relief from the bronchodilator inhaler, your asthma may actually be worsening with the tubes becoming more swollen and filled with mucus.   This can delay other crucial treatments, such as oral steroid medications. If you need to use a bronchodilator inhaler daily, several times per day, you should discuss this with your provider.  There are probably better treatments that could be used to keep your asthma under control.     HOME CARE . Only take medications as instructed by your medical team. . Complete the entire course of an antibiotic. . Drink plenty of fluids and get plenty of rest. . Avoid close contacts especially the very young and the elderly . Cover your mouth if you cough or cough into your sleeve. . Always remember to wash your hands . A steam or ultrasonic humidifier can help congestion.   GET HELP RIGHT AWAY IF: . You develop worsening fever. . You become short of breath . You cough up blood. . Your symptoms persist after you have completed your treatment plan MAKE SURE YOU   Understand these instructions.  Will watch your condition.  Will get help right away if you are not doing well or get worse.  Your e-visit answers were reviewed by a board certified advanced clinical practitioner to complete your personal care plan.  Depending on the condition, your plan could have included both over the counter or prescription medications. If there is a problem please reply  once you have received a response from your provider. Your safety is important to .  If you have drug allergies check your prescription carefully.    You can use MyChart to ask questions about today's visit, request a non-urgent  call back, or ask for a work or school excuse for 24 hours related to this e-Visit. If it has been greater than 24 hours you will need to follow up with your provider, or enter a new e-Visit to address those concerns. You will get an e-mail in the next two days asking about your experience.  I hope that your e-visit has been valuable and will speed your recovery. Thank you for using e-visits.  Approximately 5 minutes was  used in reviewing the patient's chart, questionnaire, prescribing medications, and documentation.

## 2020-07-31 ENCOUNTER — Other Ambulatory Visit: Payer: Self-pay

## 2020-07-31 ENCOUNTER — Encounter: Payer: Self-pay | Admitting: Internal Medicine

## 2020-07-31 ENCOUNTER — Telehealth (INDEPENDENT_AMBULATORY_CARE_PROVIDER_SITE_OTHER): Payer: Medicaid Other | Admitting: Internal Medicine

## 2020-07-31 DIAGNOSIS — F411 Generalized anxiety disorder: Secondary | ICD-10-CM

## 2020-07-31 DIAGNOSIS — F325 Major depressive disorder, single episode, in full remission: Secondary | ICD-10-CM

## 2020-07-31 MED ORDER — BUSPIRONE HCL 5 MG PO TABS: 5.0000 mg | ORAL_TABLET | Freq: Two times a day (BID) | ORAL | 1 refills | Status: DC

## 2020-07-31 NOTE — Progress Notes (Signed)
Virtual Visit via Telephone Note  I connected with Amy Decker, on 07/31/2020 at 11:05 AM by telephone due to the COVID-19 pandemic and verified that I am speaking with the correct person using two identifiers.   Consent: I discussed the limitations, risks, security and privacy concerns of performing an evaluation and management service by telephone and the availability of in person appointments. I also discussed with the patient that there may be a patient responsible charge related to this service. The patient expressed understanding and agreed to proceed.   Location of Patient: Home   Location of Provider: Clinic    Persons participating in Telemedicine visit: Doran Heater Dr. Earlene Plater      History of Present Illness: Patient has a visit to follow up on anxiety and depression. At last visit, 11/12, increased Lexapro to 20 mg. Patient reports she has been ok since we last talked. Depression is significantly improved. Still feels irritated and angry. Also feels like she has no appetite. Has never seen a therapist and has not been seen by Jenel Lucks, LCSW.  Depression screen [facility] 2/9 07/31/2020 05/12/2020 10/22/2019  Decreased Interest 0 1 0  Down, Depressed, Hopeless 0 2 0  PHQ - 2 Score 0 3 0  Altered sleeping 0 2 -  Tired, decreased energy 0 3 -  Change in appetite 0 3 -  Feeling bad or failure about yourself  0 0 -  Trouble concentrating 0 1 -  Moving slowly or fidgety/restless 0 1 -  Suicidal thoughts 0 0 -  PHQ-9 Score 0 13 -  Difficult doing work/chores Not difficult at all - -   GAD 7 : Generalized Anxiety Score 07/31/2020 05/12/2020 10/22/2019  Nervous, Anxious, on Edge 2 2 1   Control/stop worrying 2 2 0  Worry too much - different things 2 3 0  Trouble relaxing 2 2 1   Restless 2 0 0  Easily annoyed or irritable 2 3 0  Afraid - awful might happen 2 1 -  Total GAD 7 Score 14 13 -  Anxiety Difficulty Somewhat difficult - -       Past Medical History:  Diagnosis Date  . Peripheral vascular disease (HCC)    No Known Allergies  Current Outpatient Medications on File Prior to Visit  Medication Sig Dispense Refill  . cetirizine (ZYRTEC) 10 MG tablet Take 1 tablet (10 mg total) by mouth daily. 30 tablet 11  . escitalopram (LEXAPRO) 20 MG tablet Take 0.5 tablets (10 mg total) by mouth daily. 90 tablet 1  . azithromycin (ZITHROMAX) 250 MG tablet Take 2 tabs today, then take 1 tab daily until gone. (Patient not taking: Reported on 07/31/2020) 6 tablet 0  . baclofen (LIORESAL) 10 MG tablet Take 1 tablet (10 mg total) by mouth 3 (three) times daily. (Patient not taking: Reported on 07/31/2020) 30 each 0  . benzonatate (TESSALON) 100 MG capsule Take 1 capsule (100 mg total) by mouth 2 (two) times daily as needed for cough. (Patient not taking: Reported on 07/31/2020) 20 capsule 0  . naproxen (NAPROSYN) 500 MG tablet Take 1 tablet (500 mg total) by mouth 2 (two) times daily with a meal. (Patient not taking: Reported on 07/31/2020) 30 tablet 0   No current facility-administered medications on file prior to visit.    Observations/Objective: NAD. Speaking clearly.  Work of breathing normal.  Alert and oriented. Mood appropriate.   Assessment and Plan: 1. Major depressive disorder in full remission, unspecified whether recurrent (HCC)  PHQ-9 score significantly improved with change in Lexapro dose. Will continue at 20 mg. No safety concerns.   2. Generalized anxiety disorder GAD-7 remains elevated and patient has a lot of concerns around anxiety symptoms. Will start Buspar 5 mg BID. Discussed potential side effects. Dicussed we are starting low dose and may need to titrate. Will schedule appointment with Jenel Lucks, LCSW ASAP. Patient may benefit from establishing long term with therapist. Follow up in 4-6 weeks for medication adjustment and assessment.  - busPIRone (BUSPAR) 5 MG tablet; Take 1 tablet (5 mg total) by mouth 2  (two) times daily.  Dispense: 60 tablet; Refill: 1   Follow Up Instructions: 4-6 week f/u; f/u with Jenel Lucks, LCSW    I discussed the assessment and treatment plan with the patient. The patient was provided an opportunity to ask questions and all were answered. The patient agreed with the plan and demonstrated an understanding of the instructions.   The patient was advised to call back or seek an in-person evaluation if the symptoms worsen or if the condition fails to improve as anticipated.     I provided 9 minutes total of non-face-to-face time during this encounter including median intraservice time, reviewing previous notes, investigations, ordering medications, medical decision making, coordinating care and patient verbalized understanding at the end of the visit.    [name], D.O. Primary Care at [facility]  07/31/2020, 11:05 AM

## 2020-08-14 ENCOUNTER — Ambulatory Visit: Payer: Medicaid Other | Attending: Family Medicine | Admitting: Licensed Clinical Social Worker

## 2020-08-14 ENCOUNTER — Other Ambulatory Visit: Payer: Self-pay

## 2020-08-14 DIAGNOSIS — F411 Generalized anxiety disorder: Secondary | ICD-10-CM

## 2020-08-14 NOTE — BH Specialist Note (Signed)
Integrated Behavioral Health via Telemedicine Visit  08/14/2020 Amy Decker [id number]  Number of Integrated Behavioral Health visits: 1 Session Start time: 3:10 PM  Session End time: 3:25 PM Total time: 15  Referring Provider: Dr. Earlene Plater Patient/Family location: [address], [place], [place] [facility] Provider location: Office All persons participating in visit: LCSW and patient Types of Service: Individual psychotherapy  I connected with Amy Decker by Telephone  (Video is [employer] application) and verified that I am speaking with the correct person using two identifiers.Discussed confidentiality: Yes   I discussed the limitations of telemedicine and the availability of in person appointments.  Discussed there is a possibility of technology failure and discussed alternative modes of communication if that failure occurs.  I discussed that engaging in this telemedicine visit, they consent to the provision of behavioral healthcare and the services will be billed under their insurance.  Patient and/or legal guardian expressed understanding and consented to Telemedicine visit: Yes   Presenting Concerns: Patient and/or family reports the following symptoms/concerns: Pt reports increase in anxiety symptoms after the birth of son. Symptoms include decreased appetite, low energy, feeling jittery, difficulty obtaining sleep, and irritability. Pt endorses cold sweats for approx 2-3 weeks Duration of problem: 1 year; Severity of problem: moderate  Patient and/or Family's Strengths/Protective Factors: Social connections, Social and Emotional competence, Concrete supports in place (healthy food, safe environments, etc.) and Sense of purpose  Goals Addressed: Patient will: 1.  Reduce symptoms of: anxiety Pt agreed to continue compliance with medications 2.  Increase knowledge and/or ability of: coping skills Pt agreed to utilize healthy coping skills to assist with management  of symptoms by practicing self-care and taking breaks for self throughout the day   Progress towards Goals: Ongoing  Interventions: Interventions utilized:  Solution-Focused Strategies Standardized Assessments completed: Not Needed  Patient Response: Pt was engaged and successfully identified strategies to assist with management of symptoms  Assessment: Patient currently experiencing symptoms of anxiety.   Patient may benefit from continued medication management and practicing self-care to promote positive mood and feelings of relaxation. Pt is not interested in participating in therapy at this time.  Plan: 1. Follow up with behavioral health clinician on : Contact LCSW with any additional behavioral health and/or resource needs 2. Behavioral recommendations: Continue with compliance of medications and utilizing healthy coping skills 3. Referral(s): Integrated [employer] (In Clinic)  I discussed the assessment and treatment plan with the patient and/or parent/guardian. They were provided an opportunity to ask questions and all were answered. They agreed with the plan and demonstrated an understanding of the instructions.   They were advised to call back or seek an in-person evaluation if the symptoms worsen or if the condition fails to improve as anticipated.  Bridgett Larsson, LCSW  08/24/20 8:51 AM

## 2020-08-28 ENCOUNTER — Ambulatory Visit: Payer: Medicaid Other | Admitting: Internal Medicine

## 2020-09-14 ENCOUNTER — Telehealth: Payer: Medicaid Other | Admitting: Internal Medicine

## 2020-09-14 ENCOUNTER — Other Ambulatory Visit: Payer: Self-pay

## 2020-09-21 ENCOUNTER — Telehealth (INDEPENDENT_AMBULATORY_CARE_PROVIDER_SITE_OTHER): Payer: Medicaid Other | Admitting: Nurse Practitioner

## 2020-09-21 ENCOUNTER — Telehealth: Payer: Self-pay | Admitting: Internal Medicine

## 2020-09-21 ENCOUNTER — Telehealth: Payer: Medicaid Other | Admitting: Internal Medicine

## 2020-09-21 DIAGNOSIS — F419 Anxiety disorder, unspecified: Secondary | ICD-10-CM | POA: Diagnosis not present

## 2020-09-21 MED ORDER — HYDROXYZINE HCL 10 MG PO TABS: 10.0000 mg | ORAL_TABLET | Freq: Three times a day (TID) | ORAL | 0 refills | Status: DC | PRN

## 2020-09-21 NOTE — Telephone Encounter (Signed)
Called pt LVM to schedule one month f/u appt with PCP.

## 2020-09-21 NOTE — Progress Notes (Signed)
Has concerns with Buspar.  Does not feel that it is working. Still feels anxious.  Has cold sweats every night and still unable to eat.

## 2020-09-21 NOTE — Progress Notes (Signed)
Virtual Visit via Telephone Note  I connected with Amy Decker on 09/21/20 at  9:30 AM EDT by telephone and verified that I am speaking with the correct person using two identifiers.  Location: Patient: home Provider: office   I discussed the limitations, risks, security and privacy concerns of performing an evaluation and management service by telephone and the availability of in person appointments. I also discussed with the patient that there may be a patient responsible charge related to this service. The patient expressed understanding and agreed to proceed.   History of Present Illness:  Patient presents for follow-up on depression and anxiety.  This visit is a televisit.  Patient was last seen by Dr. Lafe Garin on 05/12/2020.  She reports that her Lexapro has been working well.  She is currently on 20 mg daily.  She was started on BuSpar at last visit for anxiety.  She states that this did not work well and stopped taking it.  We discussed a trial of Vistaril for breakthrough anxiety.  We will have her follow-up with Dr. Earlene Plater in 1 month to discuss effectiveness of medication regimen and possible referral for counseling. Denies f/c/s, n/v/d, hemoptysis, PND, chest pain or edema.     Observations/Objective:  Vitals with BMI 05/12/2020 02/20/2020 12/31/2019  Height - - -  Weight 110 lbs - -  BMI - - -  Systolic 107 97 114  Diastolic 67 51 76  Pulse 66 63 58     Assessment and Plan:  Major depressive disorder in full remission, unspecified whether recurrent (HCC) Depression significantly improved with Lexapro. Will continue at 20 mg. No safety concerns.   Generalized anxiety disorder Buspar does not work. May stop Buspar. Will trial Vistaril. Discussed potential side effects. Patient may benefit from establishing long term with therapist.   Follow up:  Follow up with Dr. Earlene Plater in 1 month     I discussed the assessment and treatment plan with the patient. The  patient was provided an opportunity to ask questions and all were answered. The patient agreed with the plan and demonstrated an understanding of the instructions.   The patient was advised to call back or seek an in-person evaluation if the symptoms worsen or if the condition fails to improve as anticipated.  I provided 22 minutes of non-face-to-face time during this encounter.   Ivonne Andrew, NP

## 2020-09-21 NOTE — Patient Instructions (Addendum)
Major depressive disorder in full remission, unspecified whether recurrent (HCC) Depression significantly improved with Lexapro. Will continue at 20 mg. No safety concerns.   Generalized anxiety disorder Buspar does not work. May stop Buspar. Will trial Vistaril. Discussed potential side effects. Patient may benefit from establishing long term with therapist.   Follow up:  Follow up with Dr. Earlene Plater in 1 month

## 2020-09-28 ENCOUNTER — Other Ambulatory Visit: Payer: Self-pay | Admitting: Nurse Practitioner

## 2020-09-28 ENCOUNTER — Other Ambulatory Visit: Payer: Self-pay | Admitting: Internal Medicine

## 2020-09-28 DIAGNOSIS — F419 Anxiety disorder, unspecified: Secondary | ICD-10-CM

## 2020-09-28 MED ORDER — HYDROXYZINE PAMOATE 25 MG PO CAPS: 25.0000 mg | ORAL_CAPSULE | Freq: Three times a day (TID) | ORAL | 0 refills | Status: DC | PRN

## 2020-10-09 ENCOUNTER — Telehealth: Payer: Medicaid Other | Admitting: Physician Assistant

## 2020-10-09 DIAGNOSIS — K219 Gastro-esophageal reflux disease without esophagitis: Secondary | ICD-10-CM | POA: Diagnosis not present

## 2020-10-09 MED ORDER — OMEPRAZOLE 40 MG PO CPDR: 40.0000 mg | DELAYED_RELEASE_CAPSULE | Freq: Every day | ORAL | 0 refills | Status: DC

## 2020-10-09 NOTE — Progress Notes (Signed)
I provided 7 minutes of non face-to-face time during this encounter for chart review and documentation.

## 2020-10-09 NOTE — Progress Notes (Signed)
E-Visit for Heartburn  We are sorry that you are not feeling well.  Here is how we plan to help!  Based on what you shared with me it looks like you most likely have Gastroesophageal Reflux Disease (GERD).  Gastroesophageal reflux disease (GERD) happens when acid from your stomach flows up into the esophagus.  When acid comes in contact with the esophagus, the acid causes sorenss (inflammation) in the esophagus.  Over time, GERD may create small holes (ulcers) in the lining of the esophagus.  I have prescribed Omeprazole 40mg . Take 30 minutes before breakfast until you follow up with your PCP. You may require further evaluation to rule out gastritis/gastric ulcer disease. You may require another medication called Sucralfate, but this should be prescribed by your PCP for monitoring of improvement in symptoms.   Your symptoms should improve in the next day or two.  You can use antacids as needed until symptoms resolve.  Call if your heartburn worsens, you have trouble swallowing, weight loss, spitting up blood or recurrent vomiting.  Home Care:  May include lifestyle changes such as weight loss, quitting smoking and alcohol consumption  Avoid foods and drinks that make your symptoms worse, such as:  Caffeine or alcoholic drinks  Chocolate  Peppermint or mint flavorings  Garlic and onions  Spicy foods  Citrus fruits, such as oranges, lemons, or limes  Tomato-based foods such as sauce, chili, salsa and pizza  Fried and fatty foods  Avoid lying down for 3 hours prior to your bedtime or prior to taking a nap  Eat small, frequent meals instead of a large meals  Wear loose-fitting clothing.  Do not wear anything tight around your waist that causes pressure on your stomach.  Raise the head of your bed 6 to 8 inches with wood blocks to help you sleep.  Extra pillows will not help.  Seek Help Right Away If:  You have pain in your arms, neck, jaw, teeth or back  Your pain  increases or changes in intensity or duration  You develop nausea, vomiting or sweating (diaphoresis)  You develop shortness of breath or you faint  Your vomit is green, yellow, black or looks like coffee grounds or blood  Your stool is red, bloody or black  These symptoms could be signs of other problems, such as heart disease, gastric bleeding or esophageal bleeding.  Make sure you :  Understand these instructions.  Will watch your condition.  Will get help right away if you are not doing well or get worse.  Your e-visit answers were reviewed by a board certified advanced clinical practitioner to complete your personal care plan.  Depending on the condition, your plan could have included both over the counter or prescription medications.  If there is a problem please reply  once you have received a response from your provider.  Your safety is important to [place].  If you have drug allergies check your prescription carefully.    You can use MyChart to ask questions about today's visit, request a non-urgent call back, or ask for a work or school excuse for 24 hours related to this e-Visit. If it has been greater than 24 hours you will need to follow up with your provider, or enter a new e-Visit to address those concerns.  You will get an e-mail in the next two days asking about your experience.  I hope that your e-visit has been valuable and will speed your recovery. Thank you for using e-visits.

## 2020-10-15 ENCOUNTER — Encounter: Payer: Self-pay | Admitting: Physician Assistant

## 2020-10-15 ENCOUNTER — Telehealth: Payer: Medicaid Other | Admitting: Physician Assistant

## 2020-10-15 ENCOUNTER — Encounter: Payer: Medicaid Other | Admitting: Physician Assistant

## 2020-10-15 DIAGNOSIS — R110 Nausea: Secondary | ICD-10-CM | POA: Diagnosis not present

## 2020-10-15 DIAGNOSIS — R059 Cough, unspecified: Secondary | ICD-10-CM

## 2020-10-15 DIAGNOSIS — J3089 Other allergic rhinitis: Secondary | ICD-10-CM | POA: Diagnosis not present

## 2020-10-15 DIAGNOSIS — R509 Fever, unspecified: Secondary | ICD-10-CM | POA: Diagnosis not present

## 2020-10-15 DIAGNOSIS — J069 Acute upper respiratory infection, unspecified: Secondary | ICD-10-CM

## 2020-10-15 DIAGNOSIS — Z20822 Contact with and (suspected) exposure to covid-19: Secondary | ICD-10-CM

## 2020-10-15 MED ORDER — ONDANSETRON HCL 4 MG PO TABS: 4.0000 mg | ORAL_TABLET | Freq: Three times a day (TID) | ORAL | 0 refills | Status: DC | PRN

## 2020-10-15 MED ORDER — AZELASTINE HCL 0.1 % NA SOLN: 1.0000 | Freq: Two times a day (BID) | NASAL | 0 refills | Status: DC

## 2020-10-15 MED ORDER — BENZONATATE 100 MG PO CAPS: 100.0000 mg | ORAL_CAPSULE | Freq: Three times a day (TID) | ORAL | 0 refills | Status: DC | PRN

## 2020-10-15 MED ORDER — PSEUDOEPH-BROMPHEN-DM 30-2-10 MG/5ML PO SYRP: 5.0000 mL | ORAL_SOLUTION | Freq: Three times a day (TID) | ORAL | 0 refills | Status: DC | PRN

## 2020-10-15 NOTE — Addendum Note (Signed)
Addended by: Demetrio Lapping on: 10/15/2020 08:00 PM   Modules accepted: Orders

## 2020-10-15 NOTE — Progress Notes (Signed)
Virtual Visit via Video Note  I connected with Amy Decker on 10/15/20 at  5:30 PM EDT by a video enabled telemedicine application and verified that I am speaking with the correct person using two identifiers.  Location: Patient: patient's home  Provider: provider's office   Person participating in the virtual visit: patient and provider    I discussed the limitations of evaluation and management by telemedicine and the availability of in person appointments. The patient expressed understanding and agreed to proceed.    I discussed the assessment and treatment plan with the patient. The patient was provided an opportunity to ask questions and all were answered. The patient agreed with the plan and demonstrated an understanding of the instructions.   The patient was advised to call back or seek an in-person evaluation if the symptoms worsen or if the condition fails to improve as anticipated.  I provided 15 minutes of non-face-to-face time during this encounter.   Demetrio Lapping, PA-C    Acute Office Visit  Subjective:    Patient ID: Amy Decker, female    DOB: 12/07/1990, 30 y.o.   MRN: [id number]  No chief complaint on file.   30 yo F in NAD connects via video and complains of cough, runny nose, sneezing, headache, body aches, nausea and fever x 2 days. States temp was 100F 2 days ago. Has been taking Tyelnol which is helping with the fever and body aches. States kids had similar sxs at home and were diagnosed with "viral infection. Denies any known expousre to someone with Covid 19/flu or other illness.   Other Associated symptoms include coughing, a fever, headaches, myalgias and nausea. Pertinent negatives include no abdominal pain, chest pain, chills, congestion or vomiting.   Patient is in today for cough, nausea, and fever  Past Medical History:  Diagnosis Date  . Peripheral vascular disease [facility])     Past Surgical History:  Procedure Laterality  Date  . CHOLECYSTECTOMY    . TONSILLECTOMY      Family History  Problem Relation Age of Onset  . Arthritis Mother   . Cancer Mother   . Hypertension Maternal Grandfather     Social History   Socioeconomic History  . Marital status: Significant Other    Spouse name: Not on file  . Number of children: Not on file  . Years of education: Not on file  . Highest education level: Not on file  Occupational History  . Occupation: [employer]  Tobacco Use  . Smoking status: Former Smoker    Types: Cigarettes  . Smokeless tobacco: Never Used  Vaping Use  . Vaping Use: Every day  Substance and Sexual Activity  . Alcohol use: Never  . Drug use: Never  . Sexual activity: Yes  Other Topics Concern  . Not on file  Social History Narrative  . Not on file   Social Determinants of Health   Financial Resource Strain: Not on file  Food Insecurity: Not on file  Transportation Needs: Not on file  Physical Activity: Not on file  Stress: Not on file  Social Connections: Not on file  Intimate Partner Violence: Not on file    Outpatient Medications Prior to Visit  Medication Sig Dispense Refill  . azelastine (ASTELIN) 0.1 % nasal spray Place 1 spray into both nostrils 2 (two) times daily. Use in each nostril as directed 30 mL 0  . benzonatate (TESSALON) 100 MG capsule Take 1-2 capsules (100-200 mg total) by mouth 3 (  three) times daily as needed for cough. 40 capsule 0  . busPIRone (BUSPAR) 5 MG tablet Take 1 tablet (5 mg total) by mouth 2 (two) times daily. 60 tablet 1  . escitalopram (LEXAPRO) 20 MG tablet Take 0.5 tablets (10 mg total) by mouth daily. 90 tablet 1  . hydrOXYzine (VISTARIL) 25 MG capsule Take 1 capsule (25 mg total) by mouth every 8 (eight) hours as needed. 30 capsule 0  . omeprazole (PRILOSEC) 40 MG capsule Take 1 capsule (40 mg total) by mouth daily. 30 capsule 0   No facility-administered medications prior to visit.    No Known Allergies  Review of Systems   Constitutional: Positive for fever. Negative for chills.  HENT: Positive for postnasal drip, rhinorrhea and sneezing. Negative for congestion, ear pain, nosebleeds, sinus pressure and sinus pain.   Respiratory: Positive for cough. Negative for shortness of breath.   Cardiovascular: Negative for chest pain.  Gastrointestinal: Positive for nausea. Negative for abdominal pain and vomiting.  Musculoskeletal: Positive for myalgias.  Neurological: Positive for headaches.  Psychiatric/Behavioral: Negative for confusion.       Objective:    Physical Exam Constitutional:      General: She is not in acute distress.    Appearance: Normal appearance. She is not ill-appearing, toxic-appearing or diaphoretic.  HENT:     Head: Normocephalic and atraumatic.  Eyes:     General: No scleral icterus. Pulmonary:     Effort: Pulmonary effort is normal.  Skin:    Coloration: Skin is not pale.  Neurological:     Mental Status: She is alert and oriented to person, place, and time.  Psychiatric:        Mood and Affect: Mood normal.        Behavior: Behavior normal.        Thought Content: Thought content normal.        Judgment: Judgment normal.     LMP 09/16/2020 (Approximate)  Wt Readings from Last 3 Encounters:  05/12/20 110 lb (49.9 kg)  12/27/19 130 lb 1.1 oz (59 kg)  12/26/19 130 lb (59 kg)    Health Maintenance Due  Topic Date Due  . Hepatitis C Screening  Never done    There are no preventive care reminders to display for this patient.   No results found for: TSH Lab Results  Component Value Date   WBC 10.3 08/28/2019   HGB 14.7 08/28/2019   HCT 43.8 08/28/2019   MCV 97.3 08/28/2019   PLT 326 08/28/2019   Lab Results  Component Value Date   NA 139 08/28/2019   K 3.6 08/28/2019   CO2 17 (L) 08/28/2019   GLUCOSE 104 (H) 08/28/2019   BUN 15 08/28/2019   CREATININE 0.95 08/28/2019   BILITOT 0.7 08/28/2019   ALKPHOS 76 08/28/2019   AST 15 08/28/2019   ALT 18  08/28/2019   PROT 7.2 08/28/2019   ALBUMIN 3.8 08/28/2019   CALCIUM 9.4 08/28/2019   ANIONGAP 9 08/28/2019   No results found for: CHOL No results found for: HDL No results found for: LDLCALC No results found for: TRIG No results found for: CHOLHDL No results found for: LOVF6E     Assessment & Plan:   Problem List Items Addressed This Visit   None   Visit Diagnoses    Cough    -  Primary   Relevant Medications   brompheniramine-pseudoephedrine-DM 30-2-10 MG/5ML syrup   Nausea       Relevant Medications   ondansetron (  ZOFRAN) 4 MG tablet   Allergic rhinitis due to other allergic trigger, unspecified seasonality       Fever, unspecified fever cause           Meds ordered this encounter  Medications  . brompheniramine-pseudoephedrine-DM 30-2-10 MG/5ML syrup    Sig: Take 5 mLs by mouth 3 (three) times daily as needed.    Dispense:  120 mL    Refill:  0    Order Specific Question:   Supervising Provider    Answer:   MILLER, BRIAN [[id number]]  . ondansetron (ZOFRAN) 4 MG tablet    Sig: Take 1 tablet (4 mg total) by mouth every 8 (eight) hours as needed for nausea or vomiting.    Dispense:  20 tablet    Refill:  0    Order Specific Question:   Supervising Provider    Answer:   Eber Hong [[id number]]   Discussed symptoms could be related to Covid 19 or the Flu. Advised to have Covid 19 testing. Pt requested work note- will provide one.  Advised to take daily OTC allergy medicine such as Zyrtec to help with underlying sxs of allergy ( runny nose, sneezing, possible headache)  Advised if sxs continue, or worsen, to follow up with her doctor and to consider Flu testing and/or possible treatment- she voiced understanding.  Information below for Covid 19 testing:  COVID-19 Vaccine Information can be found at: [email or web address].nl For questions related to vaccine distribution or appointments, please email [email or web address]  or call [phone].       Demetrio Lapping, PA-C

## 2020-10-15 NOTE — Progress Notes (Signed)
E-Visit for Corona Virus Screening  Your current symptoms could be consistent with the coronavirus.  Many health care providers can now test patients at their office but not all are.  [facility] has multiple testing sites. For information on our COVID testing locations and hours go to [email or web address]  We are enrolling you in our MyChart Home Monitoring for COVID19 . Daily you will receive a questionnaire within the MyChart website. Our COVID 19 response team will be monitoring your responses daily.  Testing Information: The COVID-19 Community Testing sites are testing BY APPOINTMENT ONLY.  You can schedule online at [email or web address]  If you do not have access to a smart phone or computer you may call [phone] for an appointment.   Additional testing sites in the Community:  . For [facility] Testing sites in [facility]  FarmerBuys.[email or web address]  . For Pop-up testing sites in [place]  [email or web address]  . For [facility] [email or web address]  . For [facility] testing in [place] and [employer] [email or web address]  . For [facility] testing in [facility]   [email or web address]  For  more information about community testing call [phone]   Please quarantine yourself while awaiting your test results. Please stay home for a minimum of 10 days from the first day of illness with improving symptoms and you have had 24 hours of no fever (without the use of Tylenol (Acetaminophen) Motrin (Ibuprofen) or any fever reducing medication).  Also - Do not get tested prior to returning to work because once you have had a positive test the test can stay  positive for more than a month in some cases.   You should wear a mask or cloth face covering over your nose and mouth if you must be around other people or animals, including pets (even at home). Try to stay at least 6 feet away from other people. This will protect the people around you.  Please continue good preventive care measures, including:  frequent hand-washing, avoid touching your face, cover coughs/sneezes, stay out of crowds and keep a 6 foot distance from others.  COVID-19 is a respiratory illness with symptoms that are similar to the flu. Symptoms are typically mild to moderate, but there have been cases of severe illness and death due to the virus.   The following symptoms may appear 2-14 days after exposure: . Fever . Cough . Shortness of breath or difficulty breathing . Chills . Repeated shaking with chills . Muscle pain . Headache . Sore throat . New loss of taste or smell . Fatigue . Congestion or runny nose . Nausea or vomiting . Diarrhea  Go to the nearest hospital ED for assessment if fever/cough/breathlessness are severe or illness seems like a threat to life.  It is vitally important that if you feel that you have an infection such as this virus or any other virus that you stay home and away from places where you may spread it to others.  You should avoid contact with people age 55 and older.   You can use medication such as  prescription cough medication called Tessalon Perles 100 mg. You may take 1-2 capsules every 8 hours as needed for cough and prescription for Azelastine nasal spray 2 sprays in each nostril twice per day   You may also take acetaminophen (Tylenol) as needed for fever.  Reduce your risk of any infection by using the same precautions used for avoiding the common cold or flu:  Marland Kitchen Wash your hands often with soap  and warm water for at least 20 seconds.  If soap and water are not readily available, use an alcohol-based hand sanitizer with at least 60%  alcohol.  . If coughing or sneezing, cover your mouth and nose by coughing or sneezing into the elbow areas of your shirt or coat, into a tissue or into your sleeve (not your hands). . Avoid shaking hands with others and consider head nods or verbal greetings only. . Avoid touching your eyes, nose, or mouth with unwashed hands.  . Avoid close contact with people who are sick. . Avoid places or events with large numbers of people in one location, like concerts or sporting events. . Carefully consider travel plans you have or are making. . If you are planning any travel outside or inside the [place], visit the CDC's Travelers' Health webpage for the latest health notices. . If you have some symptoms but not all symptoms, continue to monitor at home and seek medical attention if your symptoms worsen. . If you are having a medical emergency, call 911.  HOME CARE . Only take medications as instructed by your medical team. . Drink plenty of fluids and get plenty of rest. . A steam or ultrasonic humidifier can help if you have congestion.   GET HELP RIGHT AWAY IF YOU HAVE EMERGENCY WARNING SIGNS** FOR COVID-19. If you or someone is showing any of these signs seek emergency medical care immediately. Call 911 or proceed to your closest emergency facility if: . You develop worsening high fever. . Trouble breathing . Bluish lips or face . Persistent pain or pressure in the chest . New confusion . Inability to wake or stay awake . You cough up blood. . Your symptoms become more severe  **This list is not all possible symptoms. Contact your medical provider for any symptoms that are sever or concerning to you.  MAKE SURE YOU   Understand these instructions.  Will watch your condition.  Will get help right away if you are not doing well or get worse.  Your e-visit answers were reviewed by a board certified advanced clinical practitioner to complete your personal care plan.  Depending on the  condition, your plan could have included both over the counter or prescription medications.  If there is a problem please reply once you have received a response from your provider.  Your safety is important to [place].  If you have drug allergies check your prescription carefully.    You can use MyChart to ask questions about today's visit, request a non-urgent call back, or ask for a work or school excuse for 24 hours related to this e-Visit. If it has been greater than 24 hours you will need to follow up with your provider, or enter a new e-Visit to address those concerns. You will get an e-mail in the next two days asking about your experience.  I hope that your e-visit has been valuable and will speed your recovery. Thank you for using e-visits.   Greater than 5 minutes, yet less than 10 minutes of time have been spent researching, coordinating and implementing care for this patient today.

## 2020-10-15 NOTE — Progress Notes (Signed)
Waited for the patient to connect to video- did not connect and video visit was not performed.

## 2020-10-16 ENCOUNTER — Telehealth: Payer: Self-pay

## 2020-10-16 NOTE — Telephone Encounter (Signed)
Call pt. In regard to questionnaire responses stating increased weakness and decreased appetite. Left message and call back number.

## 2020-10-16 NOTE — Telephone Encounter (Signed)
Attempted to reach pt. Again. Message left to call back. 

## 2020-10-18 MED ORDER — PROMETHAZINE-DM 6.25-15 MG/5ML PO SYRP: 5.0000 mL | ORAL_SOLUTION | Freq: Four times a day (QID) | ORAL | 0 refills | Status: DC | PRN

## 2020-10-18 NOTE — Addendum Note (Signed)
Addended by: Waldon Merl on: 10/18/2020 11:40 AM   Modules accepted: Orders

## 2020-10-19 ENCOUNTER — Telehealth: Payer: Medicaid Other | Admitting: Orthopedic Surgery

## 2020-10-19 ENCOUNTER — Telehealth: Payer: Self-pay | Admitting: *Deleted

## 2020-10-19 DIAGNOSIS — R059 Cough, unspecified: Secondary | ICD-10-CM | POA: Diagnosis not present

## 2020-10-19 MED ORDER — PREDNISONE 20 MG PO TABS: 40.0000 mg | ORAL_TABLET | Freq: Every day | ORAL | 0 refills | Status: AC

## 2020-10-19 MED ORDER — DOXYCYCLINE HYCLATE 100 MG PO TABS: 100.0000 mg | ORAL_TABLET | Freq: Two times a day (BID) | ORAL | 0 refills | Status: AC

## 2020-10-19 NOTE — Progress Notes (Signed)
We are sorry that you are not feeling well.  Here is how we plan to help!  Based on your presentation I believe you most likely have A cough due to bacteria.  When patients have a fever and a productive cough with a change in color or increased sputum production, we are concerned about bacterial bronchitis.  If left untreated it can progress to pneumonia.  If your symptoms do not improve with your treatment plan it is important that you contact your provider.   I have prescribed Doxycycline 100 mg twice a day for 7 days  and Prednisone 40mg  daily for 5 days.   I have put another work excuse in your chart that you can access via MyChart.    From your responses in the eVisit questionnaire you describe inflammation in the upper respiratory tract which is causing a significant cough.  This is commonly called Bronchitis and has four common causes:    Allergies  Viral Infections  Acid Reflux  Bacterial Infection Allergies, viruses and acid reflux are treated by controlling symptoms or eliminating the cause. An example might be a cough caused by taking certain blood pressure medications. You stop the cough by changing the medication. Another example might be a cough caused by acid reflux. Controlling the reflux helps control the cough.  USE OF BRONCHODILATOR ("RESCUE") INHALERS: There is a risk from using your bronchodilator too frequently.  The risk is that over-reliance on a medication which only relaxes the muscles surrounding the breathing tubes can reduce the effectiveness of medications prescribed to reduce swelling and congestion of the tubes themselves.  Although you feel brief relief from the bronchodilator inhaler, your asthma may actually be worsening with the tubes becoming more swollen and filled with mucus.  This can delay other crucial treatments, such as oral steroid medications. If you need to use a bronchodilator inhaler daily, several times per day, you should discuss this with your  provider.  There are probably better treatments that could be used to keep your asthma under control.     HOME CARE . Only take medications as instructed by your medical team. . Complete the entire course of an antibiotic. . Drink plenty of fluids and get plenty of rest. . Avoid close contacts especially the very young and the elderly . Cover your mouth if you cough or cough into your sleeve. . Always remember to wash your hands . A steam or ultrasonic humidifier can help congestion.   GET HELP RIGHT AWAY IF: . You develop worsening fever. . You become short of breath . You cough up blood. . Your symptoms persist after you have completed your treatment plan MAKE SURE YOU   Understand these instructions.  Will watch your condition.  Will get help right away if you are not doing well or get worse.  Your e-visit answers were reviewed by a board certified advanced clinical practitioner to complete your personal care plan.  Depending on the condition, your plan could have included both over the counter or prescription medications. If there is a problem please reply  once you have received a response from your provider. Your safety is important to .  If you have drug allergies check your prescription carefully.    You can use MyChart to ask questions about today's visit, request a non-urgent call back, or ask for a work or school excuse for 24 hours related to this e-Visit. If it has been greater than 24 hours you will need to follow  up with your provider, or enter a new e-Visit to address those concerns. You will get an e-mail in the next two days asking about your experience.  I hope that your e-visit has been valuable and will speed your recovery. Thank you for using e-visits.  Greater than 5 minutes, yet less than 10 minutes of time have been spent researching, coordinating and implementing care for this patient today.

## 2020-10-19 NOTE — Telephone Encounter (Signed)
BPA triggered. Pt reports increased weakness and vomiting. Attempted to reach patient, left VM to CB. Symptoms and interventions, and those that warrant an ED visit, all discussed in questionnaire reply to patient. Advised to CB if needed, any further discussion, questions.

## 2020-10-23 ENCOUNTER — Telehealth: Payer: Medicaid Other | Admitting: Physician Assistant

## 2020-10-23 DIAGNOSIS — K029 Dental caries, unspecified: Secondary | ICD-10-CM

## 2020-10-23 MED ORDER — IBUPROFEN 600 MG PO TABS: 600.0000 mg | ORAL_TABLET | Freq: Three times a day (TID) | ORAL | 0 refills | Status: DC | PRN

## 2020-10-23 NOTE — Progress Notes (Signed)
E-Visit for Dental Pain  We are sorry that you are not feeling well.  Here is how we plan to help!  Based on what you have shared with me in the questionnaire, it sounds like you have a broken tooth with exposed nerve root  Ibuprofen 600mg  3 times a day for 7 days for discomfort  It is imperative that you see a dentist within 10 days of this eVisit to determine the cause of the dental pain and be sure it is adequately treated. You may need to seek urgent face to face evaluation at urgent care or ER if pain is significant.  A toothache or tooth pain is caused when the nerve in the root of a tooth or surrounding a tooth is irritated. Dental (tooth) infection, decay, injury, or loss of a tooth are the most common causes of dental pain. Pain may also occur after an extraction (tooth is pulled out). Pain sometimes originates from other areas and radiates to the jaw, thus appearing to be tooth pain.Bacteria growing inside your mouth can contribute to gum disease and dental decay, both of which can cause pain. A toothache occurs from inflammation of the central portion of the tooth called pulp. The pulp contains nerve endings that are very sensitive to pain. Inflammation to the pulp or pulpitis may be caused by dental cavities, trauma, and infection.    HOME CARE:   For toothaches: . Over-the-counter pain medications such as acetaminophen or ibuprofen may be used. Take these as directed on the package while you arrange for a dental appointment. . Avoid very cold or hot foods, because they may make the pain worse. . You may get relief from biting on a cotton ball soaked in oil of cloves. You can get oil of cloves at most drug stores.  For jaw pain: .  Aspirin may be helpful for problems in the joint of the jaw in adults. . If pain happens every time you open your mouth widely, the temporomandibular joint (TMJ) may be the source of the pain. Yawning or taking a large bite of food may worsen the pain. An  appointment with your doctor or dentist will help you find the cause.     GET HELP RIGHT AWAY IF:  . You have a high fever or chills . If you have had a recent head or face injury and develop headache, light headedness, nausea, vomiting, or other symptoms that concern you after an injury to your face or mouth, you could have a more serious injury in addition to your dental injury. . A facial rash associated with a toothache: This condition may improve with medication. Contact your doctor for them to decide what is appropriate. . Any jaw pain occurring with chest pain: Although jaw pain is most commonly caused by dental disease, it is sometimes referred pain from other areas. People with heart disease, especially people who have had stents placed, people with diabetes, or those who have had heart surgery may have jaw pain as a symptom of heart attack or angina. If your jaw or tooth pain is associated with lightheadedness, sweating, or shortness of breath, you should see a doctor as soon as possible. . Trouble swallowing or excessive pain or bleeding from gums: If you have a history of a weakened immune system, diabetes, or steroid use, you may be more susceptible to infections. Infections can often be more severe and extensive or caused by unusual organisms. Dental and gum infections in people with these conditions  may require more aggressive treatment. An abscess may need draining or IV antibiotics, for example.  MAKE SURE YOU    Understand these instructions.  Will watch your condition.  Will get help right away if you are not doing well or get worse.  Thank you for choosing an e-visit. Your e-visit answers were reviewed by a board certified advanced clinical practitioner to complete your personal care plan. Depending upon the condition, your plan could have included both over the counter or prescription medications. Please review your pharmacy choice. Make sure the pharmacy is open so you  can pick up prescription now. If there is a problem, you may contact your provider through [employer] and have the prescription routed to another pharmacy. Your safety is important to [place]. If you have drug allergies check your prescription carefully.  For the next 24 hours you can use MyChart to ask questions about today's visit, request a non-urgent call back, or ask for a work or school excuse. You will get an email in the next two days asking about your experience. I hope that your e-visit has been valuable and will speed your recovery.  I provided 5 minutes of non face-to-face time during this encounter for chart review and documentation.

## 2020-10-24 ENCOUNTER — Other Ambulatory Visit: Payer: Self-pay | Admitting: Internal Medicine

## 2020-10-24 ENCOUNTER — Telehealth: Payer: Self-pay | Admitting: Internal Medicine

## 2020-10-24 DIAGNOSIS — F411 Generalized anxiety disorder: Secondary | ICD-10-CM

## 2020-10-24 NOTE — Telephone Encounter (Signed)
Pt called stating she needs a refill on her busPIRone (BUSPAR) 5 MG tablet     Pharmacy  [facility][id number] [place], [place] - [address] AT [facility] OF [address]  [address], [place] [address]  Phone:  [phone] Fax:  [phone]  DEA #:  [id number]

## 2020-10-28 ENCOUNTER — Telehealth: Payer: Medicaid Other | Admitting: Physician Assistant

## 2020-10-28 ENCOUNTER — Encounter: Payer: Self-pay | Admitting: Physician Assistant

## 2020-10-28 DIAGNOSIS — K047 Periapical abscess without sinus: Secondary | ICD-10-CM

## 2020-10-28 DIAGNOSIS — R5081 Fever presenting with conditions classified elsewhere: Secondary | ICD-10-CM

## 2020-10-28 DIAGNOSIS — S025XXA Fracture of tooth (traumatic), initial encounter for closed fracture: Secondary | ICD-10-CM

## 2020-10-28 MED ORDER — AMOXICILLIN-POT CLAVULANATE 875-125 MG PO TABS: 1.0000 | ORAL_TABLET | Freq: Two times a day (BID) | ORAL | 0 refills | Status: DC

## 2020-10-28 NOTE — Progress Notes (Signed)
Amy Decker, Amy Decker are scheduled for a virtual visit with your provider today.    Just as we do with appointments in the office, we must obtain your consent to participate.  Your consent will be active for this visit and any virtual visit you may have with one of our providers in the next 365 days.    If you have a MyChart account, I can also send a copy of this consent to you electronically.  All virtual visits are billed to your insurance company just like a traditional visit in the office.  As this is a virtual visit, video technology does not allow for your provider to perform a traditional examination.  This may limit your provider's ability to fully assess your condition.  If your provider identifies any concerns that need to be evaluated in person or the need to arrange testing such as labs, EKG, etc, we will make arrangements to do so.    Although advances in technology are sophisticated, we cannot ensure that it will always work on either your end or our end.  If the connection with a video visit is poor, we may have to switch to a telephone visit.  With either a video or telephone visit, we are not always able to ensure that we have a secure connection.   I need to obtain your verbal consent now.   Are you willing to proceed with your visit today?   Amy Decker has provided verbal consent on 10/28/2020 for a virtual visit (video or telephone).  Amy Climes, PA-C 10/28/2020  12:53 PM  Virtual Visit via Video   I connected with patient on 10/28/20 at  1:00 PM EDT by a video enabled telemedicine application and verified that I am speaking with the correct person using two identifiers.  Location patient: Home Location provider: [facility] Persons participating in the virtual visit: Patient, Provider  I discussed the limitations of evaluation and management by telemedicine and the availability of in person appointments. The patient expressed understanding and  agreed to proceed.  Subjective:   HPI:   Patient presents via Caregility today to discuss ongoing dental pain.  Patient endorses this is been present for the past couple of weeks.  She has already submitted 2 ED visits for this issue, the first on 10/23/2020 and the latest this morning, 4/30 2020.  During both the visit she was instructed to be evaluated in person due to the severity of her symptoms and need for examination.  Patient states she has not been able to see her primary care provider for this.  Notes she reached out to her dentist but was told he left the practice.  Asked that she is been unable to get a dental appointment.  Patient endorses history of significant tooth decay of 2 upper right teeth which are causing current issue.  States she has had a history of infection in these teeth before.  Was previously unable to get fully taking care of due to lack of insurance.  Now has [place] [facility].  Patient has noted increase in pain level over the past few days.  She states it is hard sometimes to open her mouth due to level of pain.  Is alternating Tylenol and ibuprofen with only mild improvement.  Denies fever (even though she noted fever at time of her Evisit this morning -- stating that was an error) but notes she had chills a couple of days ago.  No drainage from the  tube.  Does note some gingival swelling.   ROS:   See pertinent positives and negatives per HPI.  There are no problems to display for this patient.   Social History   Tobacco Use  . Smoking status: Former Smoker    Types: Cigarettes  . Smokeless tobacco: Never Used  Substance Use Topics  . Alcohol use: Never    Current Outpatient Medications:  .  busPIRone (BUSPAR) 5 MG tablet, TAKE 1 TABLET(5 MG) BY MOUTH TWICE DAILY, Disp: 60 tablet, Rfl: 1 .  azelastine (ASTELIN) 0.1 % nasal spray, Place 1 spray into both nostrils 2 (two) times daily. Use in each nostril as directed, Disp: 30 mL, Rfl: 0 .  benzonatate  (TESSALON) 100 MG capsule, Take 1-2 capsules (100-200 mg total) by mouth 3 (three) times daily as needed for cough., Disp: 40 capsule, Rfl: 0 .  escitalopram (LEXAPRO) 20 MG tablet, Take 0.5 tablets (10 mg total) by mouth daily., Disp: 90 tablet, Rfl: 1 .  hydrOXYzine (VISTARIL) 25 MG capsule, Take 1 capsule (25 mg total) by mouth every 8 (eight) hours as needed., Disp: 30 capsule, Rfl: 0 .  ibuprofen (ADVIL) 600 MG tablet, Take 1 tablet (600 mg total) by mouth every 8 (eight) hours as needed., Disp: 30 tablet, Rfl: 0 .  omeprazole (PRILOSEC) 40 MG capsule, Take 1 capsule (40 mg total) by mouth daily., Disp: 30 capsule, Rfl: 0 .  ondansetron (ZOFRAN) 4 MG tablet, Take 1 tablet (4 mg total) by mouth every 8 (eight) hours as needed for nausea or vomiting., Disp: 20 tablet, Rfl: 0 .  promethazine-dextromethorphan (PROMETHAZINE-DM) 6.25-15 MG/5ML syrup, Take 5 mLs by mouth 4 (four) times daily as needed for cough., Disp: 118 mL, Rfl: 0  No Known Allergies  Objective:   There were no vitals taken for this visit.  Patient is well-developed, well-nourished in no acute distress.  Resting comfortably at home.  Head is normocephalic, atraumatic.  No labored breathing.  Speech is clear and coherent with logical content.  Patient is alert and oriented at baseline.   Assessment and Plan:   1. Dental abscess History of significant cavity in the area of concern, now with increased pain and swelling of gums.  Concern for dental infection/abscess.  She is afebrile.  Is talking well throughout visit, smiling and laughing at some points, without any noted pain or grimace.  Poor dentition noted while patient was talking.  Discussed with her that we cannot prescribe any prescription pain medication via E-visit.  We will start her on Augmentin twice daily with strict instructions that if things are not improving within 24 hours or anything worsens she has to be evaluated in person either at urgent care or ER  facility for detailed examination.  Discussed even if symptoms completely resolved with medication, she still needs follow-up with her dentist or at least her primary care provider.  Patient voiced understanding and agreement with plan.   Amy Climes, PA-C 10/28/2020

## 2020-10-28 NOTE — Progress Notes (Signed)
Based on what you shared with me, I feel your condition warrants further evaluation and I recommend that you be seen in a face to face office visit. Giving severity of pain and associated symptoms you need evaluation for a possible more complicated odontogenic infection as these can spread if left untreated. I recommend that you be seen urgently at an Urgent Care this morning for initial evaluation and management. They can determine if further evaluation is needed at higher care facility (ER) or can be managed with a follow-up with dentist after initial treatment.    NOTE: If you entered your credit card information for this eVisit, you will not be charged. You may see a "hold" on your card for the $35 but that hold will drop off and you will not have a charge processed.   If you are having a true medical emergency please call 911.      For an urgent face to face visit, [place] has six urgent care centers for your convenience:     [facility] Health Urgent Care Center at [facility] Directions [phone] [address] [place], [place] [address] . 8 am - 4 pm Monday - Friday    [facility] Center [facility]) Get Driving Directions [phone] [address] [place], [place] [address] . 8 am to 8 pm Monday-Friday . 10 am to 6 pm [facility] Urgent [facility]) Get Driving Directions [phone]  [address] [place],  [place]  [address] . 8 am to 8 pm Monday-Friday . 8 am to 4 pm [facility] Urgent Care at [facility] Get Driving Directions [phone] [address] [place], [place] [address] . 8 am to 8 pm Monday-Friday . 8 am to 4 pm [facility] Urgent Care at [facility] Get Driving Directions  [phone] [address] [place], [place] [address] . 8 am to 8 pm Monday-Friday . 8 am to 4 pm  [facility] Urgent Care at [facility] Directions [phone] [address], [address] [place], [place] [address] . 8 am to 8 pm Monday-Friday . 8 am to 4 pm Saturday-Sunday     Your MyChart E-visit questionnaire answers were reviewed by a board certified advanced clinical practitioner to complete your personal care plan based on your specific symptoms.  Thank you for using e-Visits.

## 2020-10-28 NOTE — Patient Instructions (Signed)
Instructions sent to patients MyChart.

## 2020-11-21 ENCOUNTER — Other Ambulatory Visit: Payer: Self-pay | Admitting: *Deleted

## 2020-11-21 ENCOUNTER — Other Ambulatory Visit: Payer: Self-pay | Admitting: Internal Medicine

## 2020-11-21 ENCOUNTER — Telehealth: Payer: Self-pay | Admitting: Internal Medicine

## 2020-11-21 DIAGNOSIS — F325 Major depressive disorder, single episode, in full remission: Secondary | ICD-10-CM

## 2020-11-21 DIAGNOSIS — F411 Generalized anxiety disorder: Secondary | ICD-10-CM

## 2020-11-21 MED ORDER — ESCITALOPRAM OXALATE 20 MG PO TABS: 10.0000 mg | ORAL_TABLET | Freq: Every day | ORAL | 1 refills | Status: DC

## 2020-11-21 NOTE — Telephone Encounter (Signed)
rx sent

## 2020-11-21 NOTE — Telephone Encounter (Signed)
Pt requesting an urgent refill of escitalopram (LEXAPRO) 20 MG tablet [[id number]]  .  Pt states she's out of this. Pharmacy  [facility] #[id number] - [place], [place] - [address] AT [facility] OF [address] [place] [address]  Phone:  [phone] Fax:  [phone]  DEA #:  [id number]   Please advise and thank you

## 2020-12-31 ENCOUNTER — Telehealth: Payer: Medicaid Other | Admitting: Physician Assistant

## 2020-12-31 DIAGNOSIS — G4489 Other headache syndrome: Secondary | ICD-10-CM

## 2020-12-31 DIAGNOSIS — R220 Localized swelling, mass and lump, head: Secondary | ICD-10-CM

## 2020-12-31 DIAGNOSIS — K0889 Other specified disorders of teeth and supporting structures: Secondary | ICD-10-CM

## 2020-12-31 DIAGNOSIS — M542 Cervicalgia: Secondary | ICD-10-CM

## 2020-12-31 NOTE — Progress Notes (Signed)
Based on what you shared with me, I feel your condition warrants further evaluation and I recommend that you be seen in a face to face visit.   Amy Decker,  Due to your dental pain and concomitant symptoms of facial swelling/neck pain, and due to previous history of similar dental pain in the past approx 3 months, it is recommended that you have a face to face visit for further evaluation of your symptoms. You may complete a video visit within Mychart, however, if there is evidence of severe infection such as visible facial/neck swelling, or pain that extends to the neck, your will be advised to go to an urgent care/ER.   NOTE: There will be NO CHARGE for this eVisit   If you are having a true medical emergency please call 911.      For an urgent face to face visit, [place] has six urgent care centers for your convenience:     [facility] Health Urgent Care Center at [facility] Directions [phone] [address] [place], [place] [address]    [facility] Health Urgent Care Center [facility]) Get Driving Directions [phone] [address] [place], [place] [address]  [facility] Health Urgent Care Center [facility]) Get Driving Directions [phone] [address] [place],  [place]  [address]  [facility] Health Urgent Care at [facility] Get Driving Directions [phone] [address] [place], [place] [address]   [facility] Health Urgent Care at [facility] Get Driving Directions  [phone] [address] [place], [place] [address]   [facility] Health Urgent Care at [facility] Directions [phone] [address], [address] [place], [place] [address]  Your MyChart E-visit questionnaire answers were reviewed by a board certified advanced clinical practitioner to complete your personal care plan based on your specific symptoms.  Thank you for using e-Visits.   I spent 5-10 minutes on review and  completion of this note- [facility]

## 2021-01-02 ENCOUNTER — Telehealth: Payer: Medicaid Other | Admitting: Physician Assistant

## 2021-01-02 DIAGNOSIS — K625 Hemorrhage of anus and rectum: Secondary | ICD-10-CM | POA: Diagnosis not present

## 2021-01-02 MED ORDER — HYDROCORTISONE ACETATE 25 MG RE SUPP: 25.0000 mg | Freq: Two times a day (BID) | RECTAL | 0 refills | Status: DC

## 2021-01-02 NOTE — Progress Notes (Signed)

## 2021-01-02 NOTE — Progress Notes (Signed)
I have spent 5 minutes in review of e-visit questionnaire, review and updating patient chart, medical decision making and response to patient.   Neola Worrall Cody Holley Wirt, PA-C    

## 2021-01-07 ENCOUNTER — Other Ambulatory Visit: Payer: Self-pay

## 2021-01-07 ENCOUNTER — Ambulatory Visit (HOSPITAL_COMMUNITY)
Admission: EM | Admit: 2021-01-07 | Discharge: 2021-01-07 | Disposition: A | Discharge status: Home / Self Care | Payer: Medicaid Other | Attending: Medical Oncology | Admitting: Medical Oncology

## 2021-01-07 ENCOUNTER — Encounter: Payer: Self-pay | Admitting: Family Medicine

## 2021-01-07 ENCOUNTER — Telehealth: Payer: Medicaid Other | Admitting: Family Medicine

## 2021-01-07 ENCOUNTER — Encounter (HOSPITAL_COMMUNITY): Payer: Self-pay

## 2021-01-07 DIAGNOSIS — K047 Periapical abscess without sinus: Secondary | ICD-10-CM

## 2021-01-07 DIAGNOSIS — K0889 Other specified disorders of teeth and supporting structures: Secondary | ICD-10-CM

## 2021-01-07 MED ORDER — HYDROCODONE-ACETAMINOPHEN 5-325 MG PO TABS
1.0000 | ORAL_TABLET | ORAL | 0 refills | Status: AC | PRN
Start: 2021-01-07 — End: 2021-01-12

## 2021-01-07 MED ORDER — AMOXICILLIN 500 MG PO CAPS: 500.0000 mg | ORAL_CAPSULE | Freq: Three times a day (TID) | ORAL | 0 refills | Status: DC

## 2021-01-07 MED ORDER — NAPROXEN 500 MG PO TABS: 500.0000 mg | ORAL_TABLET | Freq: Two times a day (BID) | ORAL | 0 refills | Status: AC

## 2021-01-07 NOTE — Progress Notes (Signed)
[name], Amy Decker are scheduled for a virtual visit with your provider today.    Just as we do with appointments in the office, we must obtain your consent to participate.  Your consent will be active for this visit and any virtual visit you may have with one of our providers in the next 365 days.    If you have a MyChart account, I can also send a copy of this consent to you electronically.  All virtual visits are billed to your insurance company just like a traditional visit in the office.  As this is a virtual visit, video technology does not allow for your provider to perform a traditional examination.  This may limit your provider's ability to fully assess your condition.  If your provider identifies any concerns that need to be evaluated in person or the need to arrange testing such as labs, EKG, etc, we will make arrangements to do so.    Although advances in technology are sophisticated, we cannot ensure that it will always work on either your end or our end.  If the connection with a video visit is poor, we may have to switch to a telephone visit.  With either a video or telephone visit, we are not always able to ensure that we have a secure connection.   I need to obtain your verbal consent now.   Are you willing to proceed with your visit today?   Amy Decker has provided verbal consent on 01/07/2021 for a virtual visit (video or telephone).   Amy Finner, NP 01/07/2021  9:19 AM   Date:  01/07/2021   ID:  Amy Decker, Amy Decker 03-31-1991, MRN [id number]  Patient Location: Home Provider Location: Home Office   Participants: Patient and Provider for Visit and Wrap up  Method of visit: Video  Location of Patient: Home Location of Provider: Home Office Consent was obtain for visit over the video. Services rendered by provider: Visit was performed via video  A video enabled telemedicine application was used and I verified that I am speaking with the correct person using  two identifiers.  PCP:  Arvilla Market, MD   Chief Complaint:  dental pain  History of Present Illness:    Amy Decker is a 30 y.o. female with history as stated below. Presents video telehealth for an acute care visit dental pain.  Onset of symptoms was yesterday and symptoms have been persistent and include: facial pain- pain in tooth and surrounding areas. Pain is the front tooth left side.  Denies having fevers, chills, shortness of breath, cough, chest pain, ear pain, sore throat or exposure to covid or other sick contacts. Modifying factors include: OTC pain relievers No other aggravating or relieving factors.  No other c/o.  Past Medical, Surgical, Social History, Allergies, and Medications have been Reviewed.  Past Medical History:  Diagnosis Date   Peripheral vascular disease (HCC)     No outpatient medications have been marked as taking for the 01/07/21 encounter (Appointment) with Amy Finner, NP.     Allergies:   Patient has no known allergies.   ROS See HPI for history of present illness.  Physical Exam HENT:     Mouth/Throat:     Lips: Pink.     Mouth: Mucous membranes are moist.     Dentition: Abnormal dentition. Dental tenderness and dental caries present.      Comments: Broken left front incisor with noted decay Pulmonary:     Effort:  Pulmonary effort is normal.  Neurological:     Mental Status: She is alert.              A&P  1. Dental infection -decaying tooth unable to get into a dentist due to insurance -S&S are consistent with infection -amoxil and naprosyn provided  -Patient acknowledged agreement and understanding of the plan.  - Reviewed side effects, risks and benefits of medication.      - amoxicillin (AMOXIL) 500 MG capsule; Take 1 capsule (500 mg total) by mouth 3 (three) times daily for 10 days.  Dispense: 30 capsule; Refill: 0 - naproxen (NAPROSYN) 500 MG tablet; Take 1 tablet (500 mg total) by mouth 2  (two) times daily with a meal for 7 days.  Dispense: 14 tablet; Refill: 0    Time:   Today, I have spent 10 minutes with the patient with telehealth technology discussing the above problems, reviewing the chart, previous notes, medications and orders.   Medication Changes: No orders of the defined types were placed in this encounter.    Disposition:  Follow up PRN  Signed, Amy Finner, NP  01/07/2021 9:19 AM

## 2021-01-07 NOTE — ED Triage Notes (Signed)
Pt reports severe dental pain in front tooth, running up to nose since last night. States has been taking amoxicillin and naproxen starting today (had video visit this AM).

## 2021-01-07 NOTE — ED Provider Notes (Signed)
[facility]    CSN: [id number] Arrival date & time: 01/07/21  1238      History   Chief Complaint Chief Complaint  Patient presents with   Dental Pain    HPI Amy Decker is a 30 y.o. female.   HPI  Dental Pain: Patient reports that she has had dental pain of one of her front upper teeth for the past 1 days.  She reports that she had a virtual visit today with an urgent care and was prescribed amoxicillin and naproxen.  She states that these have not helped control her severe pain.  She denies any fevers, vomiting or trouble breathing or swallowing.  Past Medical History:  Diagnosis Date   Peripheral vascular disease (HCC)     There are no problems to display for this patient.   Past Surgical History:  Procedure Laterality Date   CHOLECYSTECTOMY     TONSILLECTOMY      OB History     Gravida  3   Para  3   Term  3   Preterm      AB      Living  3      SAB      IAB      Ectopic      Multiple  0   Live Births  3            Home Medications    Prior to Admission medications   Medication Sig Start Date End Date Taking? Authorizing Provider  amoxicillin (AMOXIL) 500 MG capsule Take 1 capsule (500 mg total) by mouth 3 (three) times daily for 10 days. 01/07/21 01/17/21 Yes Freddy Finner, NP  busPIRone (BUSPAR) 5 MG tablet TAKE 1 TABLET(5 MG) BY MOUTH TWICE DAILY 10/25/20  Yes Arvilla Market, MD  escitalopram (LEXAPRO) 20 MG tablet Take 0.5 tablets (10 mg total) by mouth daily. 11/21/20  Yes Arvilla Market, MD  naproxen (NAPROSYN) 500 MG tablet Take 1 tablet (500 mg total) by mouth 2 (two) times daily with a meal for 7 days. 01/07/21 01/14/21 Yes Freddy Finner, NP  hydrocortisone (ANUSOL-HC) 25 MG suppository Place 1 suppository (25 mg total) rectally 2 (two) times daily. 01/02/21   Waldon Merl, PA-C    Family History Family History  Problem Relation Age of Onset   Arthritis Mother    Cancer Mother     Hypertension Maternal Grandfather     Social History Social History   Tobacco Use   Smoking status: Former    Pack years: 0.00    Types: Cigarettes   Smokeless tobacco: Never  Vaping Use   Vaping Use: Every day  Substance Use Topics   Alcohol use: Never   Drug use: Never     Allergies   Patient has no known allergies.   Review of Systems Review of Systems  As stated above in HPI Physical Exam Triage Vital Signs ED Triage Vitals  Enc Vitals Group     BP 01/07/21 1342 102/64     Pulse Rate 01/07/21 1342 78     Resp 01/07/21 1342 18     Temp 01/07/21 1342 98.1 F (36.7 C)     Temp src --      SpO2 01/07/21 1342 100 %     Weight --      Height --      Head Circumference --      Peak Flow --      Pain  Score 01/07/21 1339 10     Pain Loc --      Pain Edu? --      Excl. in GC? --    No data found.  Updated Vital Signs BP 102/64   Pulse 78   Temp 98.1 F (36.7 C)   Resp 18   LMP 12/24/2020 (Approximate)   SpO2 100%   Physical Exam Vitals and nursing note reviewed.  Constitutional:      General: She is in acute distress.     Appearance: Normal appearance. She is not ill-appearing, toxic-appearing or diaphoretic.     Comments: Tearful  HENT:     Nose: Nose normal.     Mouth/Throat:     Mouth: Mucous membranes are moist.     Dentition: Abnormal dentition. Dental tenderness, dental caries and dental abscesses present.     Pharynx: Oropharynx is clear. No oropharyngeal exudate or posterior oropharyngeal erythema.      Comments: NO tenderness, bogginess or edema of superior or inferior palate  Cardiovascular:     Rate and Rhythm: Normal rate and regular rhythm.     Pulses: Normal pulses.     Heart sounds: Normal heart sounds.  Pulmonary:     Effort: Pulmonary effort is normal.     Breath sounds: Normal breath sounds.  Musculoskeletal:     Cervical back: Neck supple.  Lymphadenopathy:     Cervical: No cervical adenopathy.  Neurological:      Mental Status: She is alert.     UC Treatments / Results  Labs (all labs ordered are listed, but only abnormal results are displayed) Labs Reviewed - No data to display  EKG   Radiology No results found.  Procedures Procedures (including critical care time)  Medications Ordered in UC Medications - No data to display  Initial Impression / Assessment and Plan / UC Course  I have reviewed the triage vital signs and the nursing notes.  Pertinent labs & imaging results that were available during my care of the patient were reviewed by me and considered in my medical decision making (see chart for details).     New.  Discussed with patient today and willing to send in a short supply of Norco for her.  Discussed how to use these medication along with common potential side effects and precautions.  Discussed black box warnings.  Discussed that she should not take this medication with anything that causes sedation or alcohol.  She will follow-up with a dentist as well as her primary care provider. Final Clinical Impressions(s) / UC Diagnoses   Final diagnoses:  None   Discharge Instructions   None    ED Prescriptions   None    PDMP not reviewed this encounter.   Rushie Chestnut, [place] 01/07/21 1409

## 2021-01-07 NOTE — Discharge Instructions (Addendum)
Please refer to the low cost community dental resources handout for follow up

## 2021-01-07 NOTE — Patient Instructions (Signed)
Dental Abscess  A dental abscess is an area of pus in or around a tooth. It comes from an infection. It can cause pain and other symptoms. Treatment will help withsymptoms and prevent the infection from spreading. Follow these instructions at home: Medicines Take over-the-counter and prescription medicines only as told by your dentist. If you were prescribed an antibiotic medicine, take it as told by your dentist. Do not stop taking it even if you start to feel better. If you were prescribed a gel that has numbing medicine in it, use it exactly as told. Do not drive or use heavy machinery (like a lawn mower) while taking prescription pain medicine. General instructions  Rinse out your mouth often with salt water. To make salt water, dissolve -1 tsp of salt in 1 cup of warm water. Eat a soft diet while your mouth is healing. Drink enough fluid to keep your urine pale yellow. Do not apply heat to the outside of your mouth. Do not use any products that contain nicotine or tobacco. These include cigarettes and e-cigarettes. If you need help quitting, ask your doctor. Keep all follow-up visits as told by your dentist. This is important.  Prevent an abscess Brush your teeth every morning and every night. Use fluoride toothpaste. Floss your teeth each day. Get dental cleanings as often as told by your dentist. Think about getting dental sealant put on teeth that have deep holes (decay). Drink water that has fluoride in it. Most tap water has fluoride. Check the label on bottled water to see if it has fluoride in it. Drink water instead of sugary drinks. Eat healthy meals and snacks. Wear a mouth guard or face shield when you play sports. Contact a doctor if: Your pain is worse, and medicine does not help. Get help right away if: You have a fever or chills. Your symptoms suddenly get worse. You have a very bad headache. You have problems breathing or swallowing. You have trouble opening  your mouth. You have swelling in your neck or close to your eye. Summary A dental abscess is an area of pus in or around a tooth. It is caused by an infection. Treatment will help with symptoms and prevent the infection from spreading. Take over-the-counter and prescription medicines only as told by your dentist. To prevent an abscess, take good care of your teeth. Brush your teeth every morning and night. Use floss every day. Get dental cleanings as often as told by your dentist. This information is not intended to replace advice given to you by your health care provider. Make sure you discuss any questions you have with your healthcare provider. Document Revised: 11/26/2019 Document Reviewed: 01/07/2020 Elsevier Patient Education  2022 Elsevier Inc.  

## 2021-01-15 ENCOUNTER — Telehealth: Payer: Medicaid Other | Admitting: Nurse Practitioner

## 2021-01-15 DIAGNOSIS — R197 Diarrhea, unspecified: Secondary | ICD-10-CM

## 2021-01-15 DIAGNOSIS — R112 Nausea with vomiting, unspecified: Secondary | ICD-10-CM | POA: Diagnosis not present

## 2021-01-15 MED ORDER — ONDANSETRON 4 MG PO TBDP: 4.0000 mg | ORAL_TABLET | Freq: Three times a day (TID) | ORAL | 0 refills | Status: DC | PRN

## 2021-01-15 NOTE — Progress Notes (Signed)
Virtual Visit Consent   Amy Decker, you are scheduled for a virtual visit with a [facility] provider today.     Just as with appointments in the office, your consent must be obtained to participate.  Your consent will be active for this visit and any virtual visit you may have with one of our providers in the next 365 days.     If you have a MyChart account, a copy of this consent can be sent to you electronically.  All virtual visits are billed to your insurance company just like a traditional visit in the office.    As this is a virtual visit, video technology does not allow for your provider to perform a traditional examination.  This may limit your provider's ability to fully assess your condition.  If your provider identifies any concerns that need to be evaluated in person or the need to arrange testing (such as labs, EKG, etc.), we will make arrangements to do so.     Although advances in technology are sophisticated, we cannot ensure that it will always work on either your end or our end.  If the connection with a video visit is poor, the visit may have to be switched to a telephone visit.  With either a video or telephone visit, we are not always able to ensure that we have a secure connection.     I need to obtain your verbal consent now.   Are you willing to proceed with your visit today?    Nalina Yeatman has provided verbal consent on 01/15/2021 for a virtual visit (video or telephone).   Viviano Simas, FNP   Date: 01/15/2021 2:48 PM   Virtual Visit via Video Note   I, Viviano Simas, connected with  Amy Decker  ([id number], 06/18/1991) on 01/15/21 at  3:00 PM EDT by a video-enabled telemedicine application and verified that I am speaking with the correct person using two identifiers.  Location: Patient: Virtual Visit Location Patient: Home Provider: Virtual Visit Location Provider: Office/Clinic   I discussed the limitations of evaluation and  management by telemedicine and the availability of in person appointments. The patient expressed understanding and agreed to proceed.    History of Present Illness: Amy Decker is a 30 y.o. who identifies as a female who was assigned female at birth, and is being seen today with complaints of nausea and body aches that started yesterday. This morning she started vomiting and has been having hot/chills off an on. She has two kids with similar symptoms. Denies any known contact with COVID-19.  She has not had the flu in the past.  Denies eating out of her house in the past 24 hours.  Denies any change of pregnancy today.   She has not taken anything for nausea.  Has taken tylenol.  She has urinated today.  She was able to keep sips of water down.  She is also having diarrhea.  Denies any recent travel outside of the country.   HPI: +nausea, +vomiting + diarrhea +body aches   Problems: There are no problems to display for this patient.   Allergies: No Known Allergies  Current Outpatient Medications  Medication Instructions   escitalopram (LEXAPRO) 10 mg, Oral, Daily     Observations/Objective: Patient is well-developed, well-nourished in no acute distress.  Resting comfortably at home.  Head is normocephalic, atraumatic.  No labored breathing.  Speech is clear and coherent with logical content.  Patient is alert and oriented  at baseline.    Assessment and Plan: 1. Nausea and vomiting, intractability of vomiting not specified, unspecified vomiting type Meds ordered this encounter  Medications   ondansetron (ZOFRAN ODT) 4 MG disintegrating tablet    Sig: Take 1 tablet (4 mg total) by mouth every 8 (eight) hours as needed for nausea or vomiting.    Dispense:  20 tablet    Refill:  0     2. Diarrhea, unspecified type BRAT diet, progress slowly. Return to work note provided.  If symptoms persist or worsen, if unable to keep fluids down for 12 hours or unable to urinate  for 12 hours seek immediate medical attention for hydration.      Follow Up Instructions: I discussed the assessment and treatment plan with the patient. The patient was provided an opportunity to ask questions and all were answered. The patient agreed with the plan and demonstrated an understanding of the instructions.  A copy of instructions were sent to the patient via MyChart.  The patient was advised to call back or seek an in-person evaluation if the symptoms worsen or if the condition fails to improve as anticipated.  Time:  I spent 10 minutes with the patient via telehealth technology discussing the above problems/concerns.    Viviano Simas, FNP

## 2021-01-18 ENCOUNTER — Other Ambulatory Visit: Payer: Self-pay

## 2021-01-18 ENCOUNTER — Ambulatory Visit (HOSPITAL_COMMUNITY)
Admission: EM | Admit: 2021-01-18 | Discharge: 2021-01-18 | Disposition: A | Discharge status: Home / Self Care | Payer: Medicaid Other

## 2021-01-18 NOTE — ED Triage Notes (Signed)
Called pt 3 x in lobby and outside no answer.

## 2021-04-12 ENCOUNTER — Telehealth: Payer: Medicaid Other | Admitting: Physician Assistant

## 2021-04-12 DIAGNOSIS — R112 Nausea with vomiting, unspecified: Secondary | ICD-10-CM | POA: Diagnosis not present

## 2021-04-12 MED ORDER — ONDANSETRON 4 MG PO TBDP: 4.0000 mg | ORAL_TABLET | Freq: Three times a day (TID) | ORAL | 0 refills | Status: DC | PRN

## 2021-04-12 NOTE — Progress Notes (Signed)
E-Visit for Vomiting  We are sorry that you are not feeling well. Here is how we plan to help!  Based on what you have shared with me it looks like you have a Virus that is irritating your GI tract.  Vomiting is the forceful emptying of a portion of the stomach's content through the mouth.  Although nausea and vomiting can make you feel miserable, it's important to remember that these are not diseases, but rather symptoms of an underlying illness.  When we treat short term symptoms, we always caution that any symptoms that persist should be fully evaluated in a medical office.  I do recommend you get a COVID test due to the other symptoms -- headache, aches, chills, etc -- along with these GI symptoms. This is to be cautious so we know to shift gears if you test positive.  I have prescribed a medication that will help alleviate your symptoms and allow you to stay hydrated:  Zofran 4 mg 1 tablet every 8 hours as needed for nausea and vomiting  HOME CARE: Drink clear liquids.  This is very important! Dehydration (the lack of fluid) can lead to a serious complication.  Start off with 1 tablespoon every 5 minutes for 8 hours. You may begin eating bland foods after 8 hours without vomiting.  Start with saltine crackers, white bread, rice, mashed potatoes, applesauce. After 48 hours on a bland diet, you may resume a normal diet. Try to go to sleep.  Sleep often empties the stomach and relieves the need to vomit.  GET HELP RIGHT AWAY IF:  Your symptoms do not improve or worsen within 2 days after treatment. You have a fever for over 3 days. You cannot keep down fluids after trying the medication.  MAKE SURE YOU:  Understand these instructions. Will watch your condition. Will get help right away if you are not doing well or get worse.   Thank you for choosing an e-visit.  Your e-visit answers were reviewed by a board certified advanced clinical practitioner to complete your personal care  plan. Depending upon the condition, your plan could have included both over the counter or prescription medications.  Please review your pharmacy choice. Make sure the pharmacy is open so you can pick up prescription now. If there is a problem, you may contact your provider through [employer] and have the prescription routed to another pharmacy.  Your safety is important to [place]. If you have drug allergies check your prescription carefully.   For the next 24 hours you can use MyChart to ask questions about today's visit, request a non-urgent call back, or ask for a work or school excuse. You will get an email in the next two days asking about your experience. I hope that your e-visit has been valuable and will speed your recovery.

## 2021-04-12 NOTE — Progress Notes (Signed)
I have spent 5 minutes in review of e-visit questionnaire, review and updating patient chart, medical decision making and response to patient.   Daymond Cordts Cody Berdell Hostetler, PA-C    

## 2021-05-03 ENCOUNTER — Telehealth: Payer: Medicaid Other | Admitting: Family Medicine

## 2021-05-03 ENCOUNTER — Telehealth: Payer: Medicaid Other | Admitting: Physician Assistant

## 2021-05-03 DIAGNOSIS — J069 Acute upper respiratory infection, unspecified: Secondary | ICD-10-CM | POA: Diagnosis not present

## 2021-05-03 DIAGNOSIS — G8929 Other chronic pain: Secondary | ICD-10-CM

## 2021-05-03 DIAGNOSIS — M5441 Lumbago with sciatica, right side: Secondary | ICD-10-CM

## 2021-05-03 MED ORDER — NAPROXEN 500 MG PO TABS: 500.0000 mg | ORAL_TABLET | Freq: Two times a day (BID) | ORAL | 0 refills | Status: DC

## 2021-05-03 MED ORDER — CYCLOBENZAPRINE HCL 10 MG PO TABS: 5.0000 mg | ORAL_TABLET | Freq: Three times a day (TID) | ORAL | 0 refills | Status: DC | PRN

## 2021-05-03 MED ORDER — PROMETHAZINE-DM 6.25-15 MG/5ML PO SYRP: 2.5000 mL | ORAL_SOLUTION | Freq: Three times a day (TID) | ORAL | 0 refills | Status: DC | PRN

## 2021-05-03 MED ORDER — FLUTICASONE PROPIONATE 50 MCG/ACT NA SUSP: 2.0000 | Freq: Every day | NASAL | 0 refills | Status: DC

## 2021-05-03 NOTE — Progress Notes (Signed)

## 2021-05-03 NOTE — Progress Notes (Signed)
E-Visit for Sinus Problems  We are sorry that you are not feeling well.  Here is how we plan to help!  Based on what you have shared with me it looks like you have sinusitis.  Sinusitis is inflammation and infection in the sinus cavities of the head.  Based on your presentation I believe you most likely have Acute Viral Sinusitis.This is an infection most likely caused by a virus. There is not specific treatment for viral sinusitis other than to help you with the symptoms until the infection runs its course.  You may use an oral decongestant such as Mucinex D or if you have glaucoma or high blood pressure use plain Mucinex. Saline nasal spray help and can safely be used as often as needed for congestion, I have prescribed: Fluticasone nasal spray two sprays in each nostril once a day  I have also sent in some cough syrup.  Some authorities believe that zinc sprays or the use of Echinacea may shorten the course of your symptoms.  Sinus infections are not as easily transmitted as other respiratory infection, however we still recommend that you avoid close contact with loved ones, especially the very young and elderly.  Remember to wash your hands thoroughly throughout the day as this is the number one way to prevent the spread of infection!  Home Care: Only take medications as instructed by your medical team. Do not take these medications with alcohol. A steam or ultrasonic humidifier can help congestion.  You can place a towel over your head and breathe in the steam from hot water coming from a faucet. Avoid close contacts especially the very young and the elderly. Cover your mouth when you cough or sneeze. Always remember to wash your hands.  Get Help Right Away If: You develop worsening fever or sinus pain. You develop a severe head ache or visual changes. Your symptoms persist after you have completed your treatment plan.  Make sure you Understand these instructions. Will watch your  condition. Will get help right away if you are not doing well or get worse.   Thank you for choosing an e-visit.  Your e-visit answers were reviewed by a board certified advanced clinical practitioner to complete your personal care plan. Depending upon the condition, your plan could have included both over the counter or prescription medications.  Please review your pharmacy choice. Make sure the pharmacy is open so you can pick up prescription now. If there is a problem, you may contact your provider through [employer] and have the prescription routed to another pharmacy.  Your safety is important to [place]. If you have drug allergies check your prescription carefully.   For the next 24 hours you can use MyChart to ask questions about today's visit, request a non-urgent call back, or ask for a work or school excuse. You will get an email in the next two days asking about your experience. I hope that your e-visit has been valuable and will speed your recovery.  I provided 5 minutes of non face-to-face time during this encounter for chart review, medication and order placement, as well as and documentation.

## 2021-06-30 ENCOUNTER — Telehealth: Payer: Medicaid Other | Admitting: Physician Assistant

## 2021-06-30 DIAGNOSIS — R112 Nausea with vomiting, unspecified: Secondary | ICD-10-CM

## 2021-06-30 MED ORDER — ONDANSETRON 4 MG PO TBDP: 4.0000 mg | ORAL_TABLET | Freq: Three times a day (TID) | ORAL | 0 refills | Status: DC | PRN

## 2021-06-30 NOTE — Progress Notes (Signed)
I have spent 5 minutes in review of e-visit questionnaire, review and updating patient chart, medical decision making and response to patient.   Mckinnley Smithey Cody Melania Kirks, PA-C    

## 2021-06-30 NOTE — Progress Notes (Signed)

## 2021-10-11 ENCOUNTER — Encounter: Payer: Self-pay | Admitting: Nurse Practitioner

## 2021-10-12 NOTE — Telephone Encounter (Signed)
Schedule appointment with Amelia Wilson, MD.

## 2021-10-15 ENCOUNTER — Ambulatory Visit: Payer: Medicaid Other | Admitting: Family Medicine

## 2021-10-17 ENCOUNTER — Ambulatory Visit: Payer: Medicaid Other | Admitting: Family Medicine

## 2021-10-19 ENCOUNTER — Telehealth: Payer: Medicaid Other | Admitting: Physician Assistant

## 2021-10-19 DIAGNOSIS — R112 Nausea with vomiting, unspecified: Secondary | ICD-10-CM | POA: Diagnosis not present

## 2021-10-19 MED ORDER — ONDANSETRON 4 MG PO TBDP: 4.0000 mg | ORAL_TABLET | Freq: Three times a day (TID) | ORAL | 0 refills | Status: DC | PRN

## 2021-10-19 NOTE — Progress Notes (Signed)
E-Visit for Nausea and Vomiting  ? ?We are sorry that you are not feeling well. Here is how we plan to help! ? ?Based on what you have shared with me it looks like you have a Virus that is irritating your GI tract.  Vomiting is the forceful emptying of a portion of the stomach's content through the mouth.  Although nausea and vomiting can make you feel miserable, it's important to remember that these are not diseases, but rather symptoms of an underlying illness.  When we treat short term symptoms, we always caution that any symptoms that persist should be fully evaluated in a medical office. ? ?I have prescribed a medication that will help alleviate your symptoms and allow you to stay hydrated: ? ?Zofran 4 mg 1 tablet every 8 hours as needed for nausea and vomiting ? ?HOME CARE: ?Drink clear liquids.  This is very important! Dehydration (the lack of fluid) can lead to a serious complication.  Start off with 1 tablespoon every 5 minutes for 8 hours. ?You may begin eating bland foods after 8 hours without vomiting.  Start with saltine crackers, white bread, rice, mashed potatoes, applesauce. ?After 48 hours on a bland diet, you may resume a normal diet. ?Try to go to sleep.  Sleep often empties the stomach and relieves the need to vomit. ? ?GET HELP RIGHT AWAY IF: ? ?Your symptoms do not improve or worsen within 2 days after treatment. ?You have a fever for over 3 days. ?You cannot keep down fluids after trying the medication. ? ?MAKE SURE YOU: ? ?Understand these instructions. ?Will watch your condition. ?Will get help right away if you are not doing well or get worse. ? ?  ?Thank you for choosing an e-visit. ? ?Your e-visit answers were reviewed by a board certified advanced clinical practitioner to complete your personal care plan. Depending upon the condition, your plan could have included both over the counter or prescription medications. ? ?Please review your pharmacy choice. Make sure the pharmacy is open so  you can pick up prescription now. If there is a problem, you may contact your provider through [employer] and have the prescription routed to another pharmacy.  Your safety is important to [place]. If you have drug allergies check your prescription carefully.  ? ?For the next 24 hours you can use MyChart to ask questions about today's visit, request a non-urgent call back, or ask for a work or school excuse. ?You will get an email in the next two days asking about your experience. I hope that your e-visit has been valuable and will speed your recovery. ? ? ?I provided 5 minutes of non face-to-face time during this encounter for chart review and documentation. , ?

## 2021-11-01 DIAGNOSIS — F411 Generalized anxiety disorder: Secondary | ICD-10-CM | POA: Diagnosis not present

## 2021-11-01 DIAGNOSIS — F332 Major depressive disorder, recurrent severe without psychotic features: Secondary | ICD-10-CM | POA: Diagnosis not present

## 2021-11-01 DIAGNOSIS — F4310 Post-traumatic stress disorder, unspecified: Secondary | ICD-10-CM | POA: Diagnosis not present

## 2021-11-07 ENCOUNTER — Telehealth: Payer: Medicaid Other | Admitting: Physician Assistant

## 2021-11-07 DIAGNOSIS — G43809 Other migraine, not intractable, without status migrainosus: Secondary | ICD-10-CM

## 2021-11-07 DIAGNOSIS — R112 Nausea with vomiting, unspecified: Secondary | ICD-10-CM

## 2021-11-07 MED ORDER — ONDANSETRON 4 MG PO TBDP: 4.0000 mg | ORAL_TABLET | Freq: Three times a day (TID) | ORAL | 0 refills | Status: DC | PRN

## 2021-11-07 NOTE — Progress Notes (Signed)
E-Visit for Nausea and Vomiting  ? ?We are sorry that you are not feeling well. Here is how we plan to help! ? ?Based on what you have shared with me it looks like you have a migraine. Although nausea and vomiting can make you feel miserable, it's important to remember that these are not diseases, but rather symptoms of an underlying illness.  When we treat short term symptoms, we always caution that any symptoms that persist should be fully evaluated in a medical office. ? ?I have prescribed a medication that will help alleviate your symptoms and allow you to stay hydrated: ? ?Zofran 4 mg 1 tablet every 8 hours as needed for nausea and vomiting ? ?HOME CARE: ?Drink clear liquids.  This is very important! Dehydration (the lack of fluid) can lead to a serious complication.  Start off with 1 tablespoon every 5 minutes for 8 hours. ?You may begin eating bland foods after 8 hours without vomiting.  Start with saltine crackers, white bread, rice, mashed potatoes, applesauce. ?After 48 hours on a bland diet, you may resume a normal diet. ?Try to go to sleep.  Sleep often empties the stomach and relieves the need to vomit. ? ?GET HELP RIGHT AWAY IF: ? ?Your symptoms do not improve or worsen within 2 days after treatment. ?You have a fever for over 3 days. ?You cannot keep down fluids after trying the medication. ? ?MAKE SURE YOU: ? ?Understand these instructions. ?Will watch your condition. ?Will get help right away if you are not doing well or get worse. ? ?  ?Thank you for choosing an e-visit. ? ?Your e-visit answers were reviewed by a board certified advanced clinical practitioner to complete your personal care plan. Depending upon the condition, your plan could have included both over the counter or prescription medications. ? ?Please review your pharmacy choice. Make sure the pharmacy is open so you can pick up prescription now. If there is a problem, you may contact your provider through [employer] and have  the prescription routed to another pharmacy.  Your safety is important to [place]. If you have drug allergies check your prescription carefully.  ? ?For the next 24 hours you can use MyChart to ask questions about today's visit, request a non-urgent call back, or ask for a work or school excuse. ?You will get an email in the next two days asking about your experience. I hope that your e-visit has been valuable and will speed your recovery. ? ? ?I provided 5 minutes of non face-to-face time during this encounter for chart review and documentation.  ? ?

## 2021-11-22 ENCOUNTER — Ambulatory Visit: Payer: Medicaid Other | Admitting: Family Medicine

## 2021-11-22 DIAGNOSIS — F4310 Post-traumatic stress disorder, unspecified: Secondary | ICD-10-CM | POA: Diagnosis not present

## 2021-11-22 DIAGNOSIS — F332 Major depressive disorder, recurrent severe without psychotic features: Secondary | ICD-10-CM | POA: Diagnosis not present

## 2021-11-22 DIAGNOSIS — F411 Generalized anxiety disorder: Secondary | ICD-10-CM | POA: Diagnosis not present

## 2022-01-23 DIAGNOSIS — F411 Generalized anxiety disorder: Secondary | ICD-10-CM | POA: Diagnosis not present

## 2022-01-23 DIAGNOSIS — F4310 Post-traumatic stress disorder, unspecified: Secondary | ICD-10-CM | POA: Diagnosis not present

## 2022-01-23 DIAGNOSIS — F332 Major depressive disorder, recurrent severe without psychotic features: Secondary | ICD-10-CM | POA: Diagnosis not present

## 2022-02-12 DIAGNOSIS — F4310 Post-traumatic stress disorder, unspecified: Secondary | ICD-10-CM | POA: Diagnosis not present

## 2022-02-12 DIAGNOSIS — F332 Major depressive disorder, recurrent severe without psychotic features: Secondary | ICD-10-CM | POA: Diagnosis not present

## 2022-02-12 DIAGNOSIS — F411 Generalized anxiety disorder: Secondary | ICD-10-CM | POA: Diagnosis not present

## 2022-03-15 DIAGNOSIS — F411 Generalized anxiety disorder: Secondary | ICD-10-CM | POA: Diagnosis not present

## 2022-03-15 DIAGNOSIS — F332 Major depressive disorder, recurrent severe without psychotic features: Secondary | ICD-10-CM | POA: Diagnosis not present

## 2022-03-28 DIAGNOSIS — F411 Generalized anxiety disorder: Secondary | ICD-10-CM | POA: Diagnosis not present

## 2022-03-28 DIAGNOSIS — F332 Major depressive disorder, recurrent severe without psychotic features: Secondary | ICD-10-CM | POA: Diagnosis not present

## 2022-03-28 DIAGNOSIS — F4310 Post-traumatic stress disorder, unspecified: Secondary | ICD-10-CM | POA: Diagnosis not present

## 2022-04-17 DIAGNOSIS — F332 Major depressive disorder, recurrent severe without psychotic features: Secondary | ICD-10-CM | POA: Diagnosis not present

## 2022-04-17 DIAGNOSIS — F4310 Post-traumatic stress disorder, unspecified: Secondary | ICD-10-CM | POA: Diagnosis not present

## 2022-04-17 DIAGNOSIS — F411 Generalized anxiety disorder: Secondary | ICD-10-CM | POA: Diagnosis not present

## 2022-05-15 ENCOUNTER — Telehealth: Payer: Medicaid Other | Admitting: Physician Assistant

## 2022-05-15 DIAGNOSIS — K0889 Other specified disorders of teeth and supporting structures: Secondary | ICD-10-CM

## 2022-05-15 MED ORDER — NAPROXEN 500 MG PO TABS: 500.0000 mg | ORAL_TABLET | Freq: Two times a day (BID) | ORAL | 0 refills | Status: DC

## 2022-05-15 MED ORDER — AMOXICILLIN 500 MG PO CAPS: 500.0000 mg | ORAL_CAPSULE | Freq: Two times a day (BID) | ORAL | 0 refills | Status: AC

## 2022-05-15 NOTE — Progress Notes (Signed)

## 2022-05-26 ENCOUNTER — Telehealth: Payer: Medicaid Other | Admitting: Nurse Practitioner

## 2022-05-26 DIAGNOSIS — K0889 Other specified disorders of teeth and supporting structures: Secondary | ICD-10-CM | POA: Diagnosis not present

## 2022-05-26 MED ORDER — IBUPROFEN 800 MG PO TABS: 800.0000 mg | ORAL_TABLET | Freq: Three times a day (TID) | ORAL | 0 refills | Status: DC | PRN

## 2022-05-26 MED ORDER — CHLORHEXIDINE GLUCONATE 0.12 % MT SOLN: 15.0000 mL | Freq: Two times a day (BID) | OROMUCOSAL | 0 refills | Status: DC

## 2022-05-26 NOTE — Progress Notes (Signed)
As you have already been prescribed an antibiotic the source of your pain is related to nerve pain and not infection. I have sent in an antibacterial mouthwash called peridex as well as ibuprofen 800 mg three times per day as needed. Unfortunately we do not prescribe anything stronger.  E-Visit for Dental Pain  A toothache or tooth pain is caused when the nerve in the root of a tooth or surrounding a tooth is irritated. Dental (tooth) infection, decay, injury, or loss of a tooth are the most common causes of dental pain. Pain may also occur after an extraction (tooth is pulled out). Pain sometimes originates from other areas and radiates to the jaw, thus appearing to be tooth pain.Bacteria growing inside your mouth can contribute to gum disease and dental decay, both of which can cause pain. A toothache occurs from inflammation of the central portion of the tooth called pulp. The pulp contains nerve endings that are very sensitive to pain. Inflammation to the pulp or pulpitis may be caused by dental cavities, trauma, and infection.    HOME CARE:   For toothaches: Over-the-counter pain medications such as acetaminophen or ibuprofen may be used. Take these as directed on the package while you arrange for a dental appointment. Avoid very cold or hot foods, because they may make the pain worse. You may get relief from biting on a cotton ball soaked in oil of cloves. You can get oil of cloves at most drug stores.  For jaw pain:  Aspirin may be helpful for problems in the joint of the jaw in adults. If pain happens every time you open your mouth widely, the temporomandibular joint (TMJ) may be the source of the pain. Yawning or taking a large bite of food may worsen the pain. An appointment with your doctor or dentist will help you find the cause.     GET HELP RIGHT AWAY IF:  You have a high fever or chills If you have had a recent head or face injury and develop headache, light headedness, nausea,  vomiting, or other symptoms that concern you after an injury to your face or mouth, you could have a more serious injury in addition to your dental injury. A facial rash associated with a toothache: This condition may improve with medication. Contact your doctor for them to decide what is appropriate. Any jaw pain occurring with chest pain: Although jaw pain is most commonly caused by dental disease, it is sometimes referred pain from other areas. People with heart disease, especially people who have had stents placed, people with diabetes, or those who have had heart surgery may have jaw pain as a symptom of heart attack or angina. If your jaw or tooth pain is associated with lightheadedness, sweating, or shortness of breath, you should see a doctor as soon as possible. Trouble swallowing or excessive pain or bleeding from gums: If you have a history of a weakened immune system, diabetes, or steroid use, you may be more susceptible to infections. Infections can often be more severe and extensive or caused by unusual organisms. Dental and gum infections in people with these conditions may require more aggressive treatment. An abscess may need draining or IV antibiotics, for example.  MAKE SURE YOU   Understand these instructions. Will watch your condition. Will get help right away if you are not doing well or get worse.  Thank you for choosing an e-visit.  Your e-visit answers were reviewed by a board certified advanced clinical practitioner to  complete your personal care plan. Depending upon the condition, your plan could have included both over the counter or prescription medications.  Please review your pharmacy choice. Make sure the pharmacy is open so you can pick up prescription now. If there is a problem, you may contact your provider through [employer] and have the prescription routed to another pharmacy.  Your safety is important to [place]. If you have drug allergies check your  prescription carefully.   For the next 24 hours you can use MyChart to ask questions about today's visit, request a non-urgent call back, or ask for a work or school excuse. You will get an email in the next two days asking about your experience. I hope that your e-visit has been valuable and will speed your recovery.

## 2022-05-26 NOTE — Progress Notes (Signed)
I have spent 5 minutes in review of e-visit questionnaire, review and updating patient chart, medical decision making and response to patient.  ° °Lindley Stachnik W Kimblery Diop, NP ° °  °

## 2022-06-01 ENCOUNTER — Telehealth: Payer: Medicaid Other | Admitting: Nurse Practitioner

## 2022-06-01 DIAGNOSIS — K0889 Other specified disorders of teeth and supporting structures: Secondary | ICD-10-CM

## 2022-06-01 NOTE — Progress Notes (Signed)
Patiet no show for visit. I attempted to contact her by phone. Left message :"as stated in evisit , done earlier today. We are not able to give stronger pain meication then currently taking in evisit or video visit. If yo need something stronger you will hav eto go to urgent care of ED".  Mary-Margaret Daphine Deutscher, FNP

## 2022-06-01 NOTE — Progress Notes (Signed)

## 2022-06-02 ENCOUNTER — Encounter (HOSPITAL_COMMUNITY): Payer: Self-pay

## 2022-06-02 ENCOUNTER — Emergency Department (HOSPITAL_COMMUNITY)
Admission: EM | Admit: 2022-06-02 | Discharge: 2022-06-02 | Disposition: A | Discharge status: Home / Self Care | Payer: Medicaid Other | Attending: Emergency Medicine | Admitting: Emergency Medicine

## 2022-06-02 ENCOUNTER — Other Ambulatory Visit: Payer: Self-pay

## 2022-06-02 ENCOUNTER — Emergency Department (HOSPITAL_COMMUNITY): Payer: Medicaid Other

## 2022-06-02 DIAGNOSIS — R1084 Generalized abdominal pain: Secondary | ICD-10-CM | POA: Insufficient documentation

## 2022-06-02 DIAGNOSIS — R112 Nausea with vomiting, unspecified: Secondary | ICD-10-CM | POA: Insufficient documentation

## 2022-06-02 DIAGNOSIS — N9489 Other specified conditions associated with female genital organs and menstrual cycle: Secondary | ICD-10-CM | POA: Insufficient documentation

## 2022-06-02 DIAGNOSIS — K769 Liver disease, unspecified: Secondary | ICD-10-CM | POA: Diagnosis not present

## 2022-06-02 DIAGNOSIS — R509 Fever, unspecified: Secondary | ICD-10-CM | POA: Diagnosis not present

## 2022-06-02 DIAGNOSIS — K0889 Other specified disorders of teeth and supporting structures: Secondary | ICD-10-CM | POA: Diagnosis not present

## 2022-06-02 DIAGNOSIS — R1111 Vomiting without nausea: Secondary | ICD-10-CM | POA: Diagnosis not present

## 2022-06-02 DIAGNOSIS — Z9049 Acquired absence of other specified parts of digestive tract: Secondary | ICD-10-CM | POA: Diagnosis not present

## 2022-06-02 LAB — CBC WITH DIFFERENTIAL/PLATELET
Abs Immature Granulocytes: 0.04 10*3/uL (ref 0.00–0.07)
Basophils Absolute: 0.1 10*3/uL (ref 0.0–0.1)
Basophils Relative: 1 %
Eosinophils Absolute: 0 10*3/uL (ref 0.0–0.5)
Eosinophils Relative: 0 %
HCT: 36.3 % (ref 36.0–46.0)
Hemoglobin: 12.4 g/dL (ref 12.0–15.0)
Immature Granulocytes: 0 %
Lymphocytes Relative: 14 %
Lymphs Abs: 1.5 10*3/uL (ref 0.7–4.0)
MCH: 32 pg (ref 26.0–34.0)
MCHC: 34.2 g/dL (ref 30.0–36.0)
MCV: 93.6 fL (ref 80.0–100.0)
Monocytes Absolute: 0.5 10*3/uL (ref 0.1–1.0)
Monocytes Relative: 5 %
Neutro Abs: 8.4 10*3/uL — ABNORMAL HIGH (ref 1.7–7.7)
Neutrophils Relative %: 80 %
Platelets: 237 10*3/uL (ref 150–400)
RBC: 3.88 MIL/uL (ref 3.87–5.11)
RDW: 12.5 % (ref 11.5–15.5)
WBC: 10.5 10*3/uL (ref 4.0–10.5)
nRBC: 0 % (ref 0.0–0.2)

## 2022-06-02 LAB — URINALYSIS, ROUTINE W REFLEX MICROSCOPIC
Bacteria, UA: NONE SEEN
Bilirubin Urine: NEGATIVE
Glucose, UA: NEGATIVE mg/dL
Hgb urine dipstick: NEGATIVE
Ketones, ur: 80 mg/dL — AB
Leukocytes,Ua: NEGATIVE
Nitrite: NEGATIVE
Protein, ur: >=300 mg/dL — AB
Specific Gravity, Urine: 1.039 — ABNORMAL HIGH (ref 1.005–1.030)
pH: 5 (ref 5.0–8.0)

## 2022-06-02 LAB — COMPREHENSIVE METABOLIC PANEL
ALT: 19 U/L (ref 0–44)
AST: 26 U/L (ref 15–41)
Albumin: 4.4 g/dL (ref 3.5–5.0)
Alkaline Phosphatase: 59 U/L (ref 38–126)
Anion gap: 15 (ref 5–15)
BUN: 15 mg/dL (ref 6–20)
CO2: 19 mmol/L — ABNORMAL LOW (ref 22–32)
Calcium: 9.7 mg/dL (ref 8.9–10.3)
Chloride: 105 mmol/L (ref 98–111)
Creatinine, Ser: 0.83 mg/dL (ref 0.44–1.00)
GFR, Estimated: >60 mL/min (ref >60)
Glucose, Bld: 114 mg/dL — ABNORMAL HIGH (ref 70–99)
Potassium: 3.4 mmol/L — ABNORMAL LOW (ref 3.5–5.1)
Sodium: 139 mmol/L (ref 135–145)
Total Bilirubin: 0.8 mg/dL (ref 0.3–1.2)
Total Protein: 7.5 g/dL (ref 6.5–8.1)

## 2022-06-02 LAB — I-STAT BETA HCG BLOOD, ED (MC, WL, AP ONLY): I-stat hCG, quantitative: <5 m[IU]/mL (ref <5)

## 2022-06-02 LAB — LIPASE, BLOOD: Lipase: 26 U/L (ref 11–51)

## 2022-06-02 MED ORDER — ONDANSETRON 4 MG PO TBDP
4.0000 mg | ORAL_TABLET | Freq: Once | ORAL | Status: AC
  Administered 2022-06-02: 4 mg via ORAL
  Filled 2022-06-02: qty 1

## 2022-06-02 MED ORDER — ONDANSETRON 4 MG PO TBDP
4.0000 mg | ORAL_TABLET | Freq: Once | ORAL | Status: AC | PRN
  Administered 2022-06-02: 4 mg via ORAL
  Filled 2022-06-02: qty 1

## 2022-06-02 MED ORDER — OXYCODONE-ACETAMINOPHEN 5-325 MG PO TABS: 1.0000 | ORAL_TABLET | ORAL | 0 refills | Status: DC | PRN

## 2022-06-02 MED ORDER — ONDANSETRON HCL 4 MG PO TABS: 4.0000 mg | ORAL_TABLET | Freq: Three times a day (TID) | ORAL | 0 refills | Status: DC | PRN

## 2022-06-02 MED ORDER — HYDROCODONE-ACETAMINOPHEN 5-325 MG PO TABS
1.0000 | ORAL_TABLET | Freq: Once | ORAL | Status: AC
  Administered 2022-06-02: 1 via ORAL
  Filled 2022-06-02: qty 1

## 2022-06-02 MED ORDER — IOHEXOL 350 MG/ML SOLN
75.0000 mL | Freq: Once | INTRAVENOUS | Status: AC | PRN
  Administered 2022-06-02: 75 mL via INTRAVENOUS

## 2022-06-02 MED ORDER — OXYCODONE-ACETAMINOPHEN 5-325 MG PO TABS
1.0000 | ORAL_TABLET | Freq: Once | ORAL | Status: AC
  Administered 2022-06-02: 1 via ORAL
  Filled 2022-06-02: qty 1

## 2022-06-02 NOTE — ED Provider Triage Note (Signed)
Emergency Medicine Provider Triage Evaluation Note  [name] , a 31 y.o. female  was evaluated in triage.  Pt complains of abdominal pain, nausea, vomiting that started around 4 PM.  Has stayed about same with intensity since then.  Reports it as severe.  Does appear to be uncomfortable during interview.  States she had swelling with teeth pulled out on Thursday.   Review of Systems  Positive: As above Negative: As above  Physical Exam  BP 105/68 (BP Location: Left Arm)   Pulse 71   Temp 98.3 F (36.8 C)   Resp (!) 22   Ht 5\' 5"  (1.651 m)   Wt 49.9 kg   SpO2 97%   BMI 18.30 kg/m  Gen:   Awake, no distress   Resp:  Normal effort MSK:   Moves extremities without difficulty  Other:  Generalized abdominal tenderness.  Without CVA tenderness.  Medical Decision Making  Medically screening exam initiated at 5:10 AM.  Appropriate orders placed.  Snigdha Howser was informed that the remainder of the evaluation will be completed by another provider, this initial triage assessment does not replace that evaluation, and the importance of remaining in the ED until their evaluation is complete.     Lourena Simmonds, PA-C 06/02/22 [phone]

## 2022-06-02 NOTE — ED Provider Notes (Signed)
[facility] EMERGENCY DEPARTMENT Provider Note   CSN: [id number] Arrival date & time: 06/02/22  0455     History  Chief Complaint  Patient presents with   Abdominal Pain    Amy Decker is a 31 y.o. female.  The history is provided by the patient and medical records. No language interpreter was used.  Abdominal Pain Pain location:  Generalized Pain quality: aching and cramping   Pain radiates to:  Does not radiate Pain severity:  Severe Onset quality:  Gradual Duration:  2 days Timing:  Constant Progression:  Improving Chronicity:  New Context comment:  New medicine taken on empty stomach Relieved by:  Nothing Worsened by:  Nothing Ineffective treatments:  None tried Associated symptoms: nausea and vomiting   Associated symptoms: no chest pain, no chills, no constipation, no cough, no diarrhea, no dysuria, no fatigue, no fever, no shortness of breath, no vaginal bleeding and no vaginal discharge        Home Medications Prior to Admission medications   Medication Sig Start Date End Date Taking? Authorizing Provider  chlorhexidine (PERIDEX) 0.12 % solution Use as directed 15 mLs in the mouth or throat 2 (two) times daily. 05/26/22   Claiborne Rigg, NP  cyclobenzaprine (FLEXERIL) 10 MG tablet Take 0.5-1 tablets (5-10 mg total) by mouth 3 (three) times daily as needed for muscle spasms. 05/03/21   Margaretann Loveless, PA-C  escitalopram (LEXAPRO) 20 MG tablet Take 0.5 tablets (10 mg total) by mouth daily. 11/21/20   Arvilla Market, MD  fluticasone (FLONASE) 50 MCG/ACT nasal spray Place 2 sprays into both nostrils daily. 05/03/21   Freddy Finner, NP  ibuprofen (ADVIL) 800 MG tablet Take 1 tablet (800 mg total) by mouth every 8 (eight) hours as needed. 05/26/22   Claiborne Rigg, NP  naproxen (NAPROSYN) 500 MG tablet Take 1 tablet (500 mg total) by mouth 2 (two) times daily with a meal. 05/15/22   Burnette, Alessandra Bevels, PA-C  ondansetron  (ZOFRAN ODT) 4 MG disintegrating tablet Take 1 tablet (4 mg total) by mouth every 8 (eight) hours as needed for nausea or vomiting. 11/07/21   Margaretann Loveless, PA-C      Allergies    Patient has no known allergies.    Review of Systems   Review of Systems  Constitutional:  Negative for chills, fatigue and fever.  HENT:  Positive for dental problem. Negative for congestion.   Respiratory:  Negative for cough, chest tightness, shortness of breath and wheezing.   Cardiovascular:  Negative for chest pain.  Gastrointestinal:  Positive for abdominal pain, nausea and vomiting. Negative for constipation and diarrhea.  Genitourinary:  Negative for dysuria, flank pain, vaginal bleeding and vaginal discharge.  Musculoskeletal:  Negative for back pain.  Skin:  Negative for rash.  Neurological:  Negative for light-headedness and headaches.  Psychiatric/Behavioral:  Negative for agitation.   All other systems reviewed and are negative.   Physical Exam Updated Vital Signs BP 113/68 (BP Location: Left Arm)   Pulse (!) 59   Temp 98.2 F (36.8 C) (Oral)   Resp 18   Ht 5\' 5"  (1.651 m)   Wt 49.9 kg   SpO2 99%   BMI 18.30 kg/m  Physical Exam Vitals and nursing note reviewed.  Constitutional:      General: She is not in acute distress.    Appearance: She is well-developed. She is not ill-appearing, toxic-appearing or diaphoretic.  HENT:     Head:  Comments: Patient has numerous teeth removed from upper jaw.  No evidence of acute infection on initial exam with no erythema or drainage seen.    Mouth/Throat:   Eyes:     Extraocular Movements: Extraocular movements intact.     Conjunctiva/sclera: Conjunctivae normal.  Cardiovascular:     Rate and Rhythm: Normal rate and regular rhythm.     Heart sounds: No murmur heard. Pulmonary:     Effort: Pulmonary effort is normal. No respiratory distress.     Breath sounds: Normal breath sounds.  Abdominal:     General: Abdomen is flat. Bowel  sounds are normal. There is no distension.     Palpations: Abdomen is soft.     Tenderness: There is no abdominal tenderness. There is no right CVA tenderness or left CVA tenderness.  Musculoskeletal:        General: No swelling.     Cervical back: Neck supple.  Skin:    General: Skin is warm and dry.     Capillary Refill: Capillary refill takes less than 2 seconds.  Neurological:     Mental Status: She is alert.  Psychiatric:        Mood and Affect: Mood normal.     ED Results / Procedures / Treatments   Labs (all labs ordered are listed, but only abnormal results are displayed) Labs Reviewed  COMPREHENSIVE METABOLIC PANEL - Abnormal; Notable for the following components:      Result Value   Potassium 3.4 (*)    CO2 19 (*)    Glucose, Bld 114 (*)    All other components within normal limits  URINALYSIS, ROUTINE W REFLEX MICROSCOPIC - Abnormal; Notable for the following components:   APPearance HAZY (*)    Specific Gravity, Urine 1.039 (*)    Ketones, ur 80 (*)    Protein, ur >=300 (*)    All other components within normal limits  CBC WITH DIFFERENTIAL/PLATELET - Abnormal; Notable for the following components:   Neutro Abs 8.4 (*)    All other components within normal limits  LIPASE, BLOOD  I-STAT BETA HCG BLOOD, ED ([facility], [facility], [facility] ONLY)    EKG None  Radiology CT ABDOMEN PELVIS W CONTRAST  Result Date: 06/02/2022 CLINICAL DATA:  31 year old female with history of acute onset of nonlocalized abdominal pain. EXAM: CT ABDOMEN AND PELVIS WITH CONTRAST TECHNIQUE: Multidetector CT imaging of the abdomen and pelvis was performed using the standard protocol following bolus administration of intravenous contrast. RADIATION DOSE REDUCTION: This exam was performed according to the departmental dose-optimization program which includes automated exposure control, adjustment of the mA and/or kV according to patient size and/or use of iterative reconstruction technique. CONTRAST:  62mL  OMNIPAQUE IOHEXOL 350 MG/ML SOLN COMPARISON:  No priors. FINDINGS: Lower chest: Unremarkable. Hepatobiliary: 9 mm low-attenuation lesion in segment 7 of the liver, too small to characterize, but statistically likely a tiny cyst (no imaging follow-up recommended). No other suspicious hepatic lesions. No intra or extrahepatic biliary ductal dilatation. Status post cholecystectomy. Pancreas: No pancreatic mass. No pancreatic ductal dilatation. No pancreatic or peripancreatic fluid collections or inflammatory changes. Spleen: Unremarkable. Adrenals/Urinary Tract: Mild multifocal cortical thinning in the right kidney. Left kidney is unremarkable in appearance. Bilateral adrenal glands are normal in appearance. Urinary bladder is nearly completely decompressed, but otherwise unremarkable in appearance. Stomach/Bowel: The appearance of the stomach is normal. No pathologic dilatation of small bowel or colon. The appendix is not confidently identified and may be surgically absent. Regardless, there are no  inflammatory changes noted adjacent to the cecum to suggest the presence of an acute appendicitis at this time. Vascular/Lymphatic: No aneurysm identified in the visualized abdominal vasculature. No lymphadenopathy noted in the abdomen. Reproductive: Uterus and right ovary are unremarkable in appearance. Thick-walled rim enhancing structure measuring 1.5 cm in the left ovary, most compatible with a degenerating corpus luteum cyst. Other: Small volume of free fluid in the cul-de-sac, likely physiologic. No larger volume of ascites. No pneumoperitoneum. Musculoskeletal: There are no aggressive appearing lytic or blastic lesions noted in the visualized portions of the skeleton. IMPRESSION: 1. No definite acute findings are noted in the abdomen or pelvis to account for the patient's symptoms. 2. There is what appears to be a degenerating corpus luteum cyst in the left ovary, and small volume of presumably physiologic free fluid  lying dependently in the low anatomic pelvis. 3. Mild multifocal cortical scarring in the right kidney, likely sequela of prior infection. 4. Status post cholecystectomy. Electronically Signed   By: Trudie Reed M.D.   On: 06/02/2022 07:59    Procedures Procedures    Medications Ordered in ED Medications  ondansetron (ZOFRAN-ODT) disintegrating tablet 4 mg (4 mg Oral Given 06/02/22 0506)  HYDROcodone-acetaminophen (NORCO/VICODIN) 5-325 MG per tablet 1 tablet (1 tablet Oral Given 06/02/22 0516)  iohexol (OMNIPAQUE) 350 MG/ML injection 75 mL (75 mLs Intravenous Contrast Given 06/02/22 0642)  oxyCODONE-acetaminophen (PERCOCET/ROXICET) 5-325 MG per tablet 1 tablet (1 tablet Oral Given 06/02/22 1341)  ondansetron (ZOFRAN-ODT) disintegrating tablet 4 mg (4 mg Oral Given 06/02/22 1341)    ED Course/ Medical Decision Making/ A&P                           Medical Decision Making Amount and/or Complexity of Data Reviewed Labs: ordered.  Risk Prescription drug management.    Keely Drennan is a 31 y.o. female with a past medical history significant for previous cholecystectomy, peripheral vascular disease, tonsillectomy, and recent dental surgery with 21 teeth pulled several days ago who presents with nausea, vomiting, continued dental pain, and now abdominal pain.  Patient reports that she was started on amoxicillin after having all of her teeth pulled and the pain was so severe she had nausea and vomiting and then had to take the antibiotics on empty stomach.  She thinks this caused her abdominal pain with more nausea and vomiting.  She denies constipation, diarrhea, or any vaginal complaints.  Denies any trauma.  She denies any fevers, chills, congestion, cough, neck pain, back pain, or flank pain.  She is feeling better now that she had some pain and nausea medicine in the waiting room.  She reports that she does not think she has a dental infection as it is feeling better now.  On exam,  lungs clear and chest nontender.  Abdomen was nontender with normal bowel sounds.  No focal neurologic deficits.  Patient resting comfortably.  Patient has evidence of tooth removal across her entire upper jaw but did not have any evidence of erythema or drainage acutely.  Patient otherwise well-appearing.  Patient had labs and imaging ordered in triage that were overall reassuring.  CT scan does not show appendicitis or other acute intra-abdominal pathology but does show a likely ovarian cyst.  She is denying any lower abdominal pain and patient does not think this is an ovarian pain she suspect this is from her stomach.  Given her lack of pain in the pelvic area or lower abdomen  I agree.  Patient will follow-up with PCP for this.  After reassuring workup, I do feel she safe for discharge home.  She is tolerating p.o. and we will give her prescription for some pain and nausea medicine she will call her dentist for further follow-up.  She had no other questions or concerns and was discharged in good condition after reassuring workup otherwise.        Final Clinical Impression(s) / ED Diagnoses Final diagnoses:  Generalized abdominal pain  Pain, dental  Nausea and vomiting, unspecified vomiting type    Rx / DC Orders ED Discharge Orders          Ordered    oxyCODONE-acetaminophen (PERCOCET/ROXICET) 5-325 MG tablet  Every 4 hours PRN        06/02/22 1440    ondansetron (ZOFRAN) 4 MG tablet  Every 8 hours PRN        06/02/22 1440           Clinical Impression: 1. Generalized abdominal pain   2. Pain, dental   3. Nausea and vomiting, unspecified vomiting type     Disposition: Discharge  Condition: Good  I have discussed the results, Dx and Tx plan with the pt(& family if present). He/she/they expressed understanding and agree(s) with the plan. Discharge instructions discussed at great length. Strict return precautions discussed and pt &/or family have verbalized understanding  of the instructions. No further questions at time of discharge.    Discharge Medication List as of 06/02/2022  2:40 PM     START taking these medications   Details  ondansetron (ZOFRAN) 4 MG tablet Take 1 tablet (4 mg total) by mouth every 8 (eight) hours as needed for nausea or vomiting., Starting Sun 06/02/2022, Normal    oxyCODONE-acetaminophen (PERCOCET/ROXICET) 5-325 MG tablet Take 1 tablet by mouth every 4 (four) hours as needed for severe pain., Starting Sun 06/02/2022, Normal        Follow Up: Arvilla MarketWallace, Catherine Lauren, MD [address] [place] [id number] [phone]        Hilary Milks, Canary Brimhristopher J, MD 06/02/22 [phone]

## 2022-06-02 NOTE — Discharge Instructions (Addendum)
Your workup today did not show any evidence of acute abnormalities in the abdomen pelvis aside from the small ovarian cyst as we discussed.  I suspect this is incidental finding as your pain was not in that location.  I suspect that your nausea with vomiting, and symptoms in your abdomen were due to taking the antibiotics on empty stomach due to the nausea and vomiting from your pain.  Please use the pain medicine and nausea medicine to help maintain hydration and use your antibiotics as prescribed before.  We do not suspect allergic reaction and you agreed.  Please follow-up with your PCP and if any symptoms change or worsen acutely, please return to the nearest emergency department.

## 2022-06-02 NOTE — ED Triage Notes (Signed)
BIB EMS on Thursday had 21 teeth pulled out and prescribed amoxicillin.  Reports loss of appetitie and vomiting and abd pain that started yesterday and around 9pm vomiting started.  No meds given by EMS.

## 2022-06-02 NOTE — ED Notes (Signed)
Patient left without being seen, states she is going to urgent care to be seen, IV removed by this tech

## 2022-06-02 NOTE — ED Notes (Signed)
Pt provided apple sauce and ginger ale.

## 2022-06-06 ENCOUNTER — Encounter: Payer: Self-pay | Admitting: Emergency Medicine

## 2022-06-06 ENCOUNTER — Other Ambulatory Visit: Payer: Self-pay

## 2022-06-06 ENCOUNTER — Ambulatory Visit
Admission: EM | Admit: 2022-06-06 | Discharge: 2022-06-06 | Disposition: A | Discharge status: Home / Self Care | Payer: Medicaid Other | Attending: Internal Medicine | Admitting: Internal Medicine

## 2022-06-06 DIAGNOSIS — K0889 Other specified disorders of teeth and supporting structures: Secondary | ICD-10-CM

## 2022-06-06 DIAGNOSIS — K047 Periapical abscess without sinus: Secondary | ICD-10-CM

## 2022-06-06 MED ORDER — OXYCODONE-ACETAMINOPHEN 5-325 MG PO TABS: 1.0000 | ORAL_TABLET | Freq: Four times a day (QID) | ORAL | 0 refills | Status: DC | PRN

## 2022-06-06 MED ORDER — CLINDAMYCIN HCL 150 MG PO CAPS: 450.0000 mg | ORAL_CAPSULE | Freq: Three times a day (TID) | ORAL | 0 refills | Status: DC

## 2022-06-06 NOTE — Discharge Instructions (Signed)
Stop amoxicillin.  Start clindamycin.  Take this with food.  I have prescribed a few pills of oxycodone.  Please keep in mind this can cause drowsiness so do not drive or drink alcohol while taking it.  Do not take any additional Tylenol with this as it does have Tylenol in it.  Follow-up with dentist as soon as possible for further evaluation and management.

## 2022-06-06 NOTE — ED Provider Notes (Signed)
[facility]    CSN: [id number] Arrival date & time: 06/06/22  1627      History   Chief Complaint Chief Complaint  Patient presents with   Dental Pain    HPI Amy Decker is a 31 y.o. female.   Patient presents with severe persistent dental pain since having 21 teeth extracted approximately 1 week ago.  Patient has been seen multiple times for dental pain since surgery.  Patient has not contacted her dentist since surgery occurred.  She was seen on 05/15/2022 by e-visit and prescribed amoxicillin and naproxen.  She was then seen on 05/26/2022 and prescribed a mouthwash as well as ibuprofen.  She was seen on 06/01/2022 and referred to be seen in ED.  She was then seen in ED on 06/02/2022 and was prescribed oxycodone.  She reports that the only pain medication that has been helpful is oxycodone but she has not run out of it.  Patient is not able to contact dentist until she gets her boyfriend's work schedule so that she has transportation.  She reports that she is going to call them on Sunday which is in approximately 3 to 4 days.  She is concerned for infection given that she has noticed some increased swelling to the right lower portion of her mouth as well.  Denies associated fever.   Dental Pain   Past Medical History:  Diagnosis Date   Peripheral vascular disease (HCC)     There are no problems to display for this patient.   Past Surgical History:  Procedure Laterality Date   CHOLECYSTECTOMY     TONSILLECTOMY      OB History     Gravida  3   Para  3   Term  3   Preterm      AB      Living  3      SAB      IAB      Ectopic      Multiple  0   Live Births  3            Home Medications    Prior to Admission medications   Medication Sig Start Date End Date Taking? Authorizing Provider  clindamycin (CLEOCIN) 150 MG capsule Take 3 capsules (450 mg total) by mouth 3 (three) times daily for 5 days. 06/06/22 06/11/22 Yes ,  Acie Fredrickson, FNP  oxyCODONE-acetaminophen (PERCOCET/ROXICET) 5-325 MG tablet Take 1 tablet by mouth every 6 (six) hours as needed for severe pain. 06/06/22  Yes , Rolly Salter E, FNP  chlorhexidine (PERIDEX) 0.12 % solution Use as directed 15 mLs in the mouth or throat 2 (two) times daily. 05/26/22   Claiborne Rigg, NP  cyclobenzaprine (FLEXERIL) 10 MG tablet Take 0.5-1 tablets (5-10 mg total) by mouth 3 (three) times daily as needed for muscle spasms. 05/03/21   Margaretann Loveless, PA-C  escitalopram (LEXAPRO) 20 MG tablet Take 0.5 tablets (10 mg total) by mouth daily. 11/21/20   Arvilla Market, MD  fluticasone (FLONASE) 50 MCG/ACT nasal spray Place 2 sprays into both nostrils daily. 05/03/21   Freddy Finner, NP  ibuprofen (ADVIL) 800 MG tablet Take 1 tablet (800 mg total) by mouth every 8 (eight) hours as needed. 05/26/22   Claiborne Rigg, NP  naproxen (NAPROSYN) 500 MG tablet Take 1 tablet (500 mg total) by mouth 2 (two) times daily with a meal. 05/15/22   Burnette, Alessandra Bevels, PA-C  ondansetron (ZOFRAN ODT) 4 MG  disintegrating tablet Take 1 tablet (4 mg total) by mouth every 8 (eight) hours as needed for nausea or vomiting. 11/07/21   Margaretann Loveless, PA-C  ondansetron (ZOFRAN) 4 MG tablet Take 1 tablet (4 mg total) by mouth every 8 (eight) hours as needed for nausea or vomiting. 06/02/22   Tegeler, Canary Brim, MD    Family History Family History  Problem Relation Age of Onset   Arthritis Mother    Cancer Mother    Hypertension Maternal Grandfather     Social History Social History   Tobacco Use   Smoking status: Former    Types: Cigarettes   Smokeless tobacco: Never  Vaping Use   Vaping Use: Every day  Substance Use Topics   Alcohol use: Never   Drug use: Never     Allergies   Patient has no known allergies.   Review of Systems Review of Systems Per HPI  Physical Exam Triage Vital Signs ED Triage Vitals  Enc Vitals Group     BP 06/06/22 1708 110/75      Pulse Rate 06/06/22 1708 87     Resp 06/06/22 1708 18     Temp 06/06/22 1708 98 F (36.7 C)     Temp Source 06/06/22 1708 Oral     SpO2 06/06/22 1708 97 %     Weight --      Height --      Head Circumference --      Peak Flow --      Pain Score 06/06/22 1709 10     Pain Loc --      Pain Edu? --      Excl. in GC? --    No data found.  Updated Vital Signs BP 110/75 (BP Location: Left Arm)   Pulse 87   Temp 98 F (36.7 C) (Oral)   Resp 18   SpO2 97%   Visual Acuity Right Eye Distance:   Left Eye Distance:   Bilateral Distance:    Right Eye Near:   Left Eye Near:    Bilateral Near:     Physical Exam Constitutional:      General: She is not in acute distress.    Appearance: Normal appearance. She is not toxic-appearing or diaphoretic.     Comments: Patient is crying due to pain.  HENT:     Head: Normocephalic and atraumatic.     Mouth/Throat:     Dentition: Abnormal dentition. Gingival swelling present.     Comments: Patient is missing multiple teeth.  Patient has some mild erythema and swelling to right lower back dentition. Eyes:     Extraocular Movements: Extraocular movements intact.     Conjunctiva/sclera: Conjunctivae normal.  Pulmonary:     Effort: Pulmonary effort is normal.  Neurological:     General: No focal deficit present.     Mental Status: She is alert and oriented to person, place, and time. Mental status is at baseline.  Psychiatric:        Mood and Affect: Mood normal.        Behavior: Behavior normal.        Thought Content: Thought content normal.        Judgment: Judgment normal.      UC Treatments / Results  Labs (all labs ordered are listed, but only abnormal results are displayed) Labs Reviewed - No data to display  EKG   Radiology No results found.  Procedures Procedures (including critical care time)  Medications Ordered  in UC Medications - No data to display  Initial Impression / Assessment and Plan / UC Course   I have reviewed the triage vital signs and the nursing notes.  Pertinent labs & imaging results that were available during my care of the patient were reviewed by me and considered in my medical decision making (see chart for details).     Patient is still taking amoxicillin but has not had much benefit.  Therefore, I do think patient would benefit from clindamycin.  Patient to stop amoxicillin and start clindamycin.  Patient was educated on gastrointestinal symptoms that can occur with clindamycin and advised to take this with food.  She was advised to stop taking this and follow-up if she has excessive diarrhea following clindamycin.  Patient is requesting pain medication.  Advised patient of the risk of taking narcotic medications and that we are limited here in urgent care on prescriptions for narcotic medications.  Patient voiced understanding but reports that she is in a lot of pain and no other medications have been helpful.  Therefore, will prescribe patient a few pills of oxycodone to get her through the next few days until she can see the dentist.  PDMP reviewed.  Patient was advised again that this can cause drowsiness and do not drive or drink alcohol while taking it.  She was also advised that it contains Tylenol and to not take any additional Tylenol with taking this medication.  Patient was advised of the importance of following up with a dentist for further evaluation and management.  Patient verbalized understanding and was agreeable with plan. Final Clinical Impressions(s) / UC Diagnoses   Final diagnoses:  Pain, dental  Dental infection     Discharge Instructions      Stop amoxicillin.  Start clindamycin.  Take this with food.  I have prescribed a few pills of oxycodone.  Please keep in mind this can cause drowsiness so do not drive or drink alcohol while taking it.  Do not take any additional Tylenol with this as it does have Tylenol in it.  Follow-up with dentist as soon as  possible for further evaluation and management.    ED Prescriptions     Medication Sig Dispense Auth. Provider   clindamycin (CLEOCIN) 150 MG capsule Take 3 capsules (450 mg total) by mouth 3 (three) times daily for 5 days. 45 capsule Weston, Trenton E, [place]   oxyCODONE-acetaminophen (PERCOCET/ROXICET) 5-325 MG tablet Take 1 tablet by mouth every 6 (six) hours as needed for severe pain. 5 tablet Slana, [place] E, [place]      I have reviewed the PDMP during this encounter.   Gustavus Bryant, [place] 06/06/22 [phone]

## 2022-06-06 NOTE — ED Triage Notes (Signed)
Pt here for dental pain since having multiple teeth extracted 1 week ago

## 2022-06-10 ENCOUNTER — Ambulatory Visit
Admission: EM | Admit: 2022-06-10 | Discharge: 2022-06-10 | Disposition: A | Discharge status: Home / Self Care | Payer: Medicaid Other

## 2022-06-10 ENCOUNTER — Emergency Department (HOSPITAL_COMMUNITY)
Admission: EM | Admit: 2022-06-10 | Discharge: 2022-06-10 | Disposition: A | Discharge status: Home / Self Care | Payer: Medicaid Other | Attending: Medical | Admitting: Medical

## 2022-06-10 ENCOUNTER — Telehealth: Payer: Medicaid Other | Admitting: Physician Assistant

## 2022-06-10 DIAGNOSIS — K0889 Other specified disorders of teeth and supporting structures: Secondary | ICD-10-CM

## 2022-06-10 DIAGNOSIS — K029 Dental caries, unspecified: Secondary | ICD-10-CM | POA: Diagnosis not present

## 2022-06-10 MED ORDER — AMOXICILLIN-POT CLAVULANATE 875-125 MG PO TABS: 1.0000 | ORAL_TABLET | Freq: Two times a day (BID) | ORAL | 0 refills | Status: DC

## 2022-06-10 MED ORDER — OXYCODONE-ACETAMINOPHEN 5-325 MG PO TABS: 1.0000 | ORAL_TABLET | Freq: Four times a day (QID) | ORAL | 0 refills | Status: AC | PRN

## 2022-06-10 MED ORDER — AMOXICILLIN-POT CLAVULANATE 875-125 MG PO TABS
1.0000 | ORAL_TABLET | Freq: Once | ORAL | Status: AC
  Administered 2022-06-10: 1 via ORAL
  Filled 2022-06-10: qty 1

## 2022-06-10 MED ORDER — HYDROCODONE-ACETAMINOPHEN 5-325 MG PO TABS
1.0000 | ORAL_TABLET | Freq: Once | ORAL | Status: AC
  Administered 2022-06-10: 1 via ORAL
  Filled 2022-06-10: qty 1

## 2022-06-10 NOTE — Discharge Instructions (Addendum)
Please only ingest liquids until seeing your doctor. Take pain medication as prescribed. Use ice to help with pain and swelling. Take antibiotics as prescribed. Follow-up with your dentist as instructed.

## 2022-06-10 NOTE — ED Provider Notes (Signed)
[facility] EMERGENCY DEPARTMENT Provider Note   CSN: [id number] Arrival date & time: 06/10/22  2046     History  Chief Complaint  Patient presents with   Dental Pain    Amy Decker is a 31 y.o. female, no pertinent past medical history, who presents to the ED secondary to dental pain on the right lower side of her mouth been going on for the last week.  States she had 20 teeth pulled last week secondary to poor dentition, and has been in pain since then.  States that the pain is severe, aching, and localized to her lower jaw and radiates to her ear.  Denies any fever, chills.  Went to urgent care and prescribed clindamycin, that did not help with her pain.  Denies any swelling of the face.     Home Medications Prior to Admission medications   Medication Sig Start Date End Date Taking? Authorizing Provider  amoxicillin-clavulanate (AUGMENTIN) 875-125 MG tablet Take 1 tablet by mouth every 12 (twelve) hours. 06/10/22  Yes Zeriyah Wain L, PA  oxyCODONE-acetaminophen (PERCOCET/ROXICET) 5-325 MG tablet Take 1 tablet by mouth every 6 (six) hours as needed for severe pain. 06/10/22  Yes Marquita Lias L, PA  ibuprofen (ADVIL) 800 MG tablet Take 1 tablet (800 mg total) by mouth every 8 (eight) hours as needed. 05/26/22   Claiborne Rigg, NP  naproxen (NAPROSYN) 500 MG tablet Take 1 tablet (500 mg total) by mouth 2 (two) times daily with a meal. 05/15/22   Burnette, Alessandra Bevels, PA-C  ondansetron (ZOFRAN) 4 MG tablet Take 1 tablet (4 mg total) by mouth every 8 (eight) hours as needed for nausea or vomiting. 06/02/22   Tegeler, Canary Brim, MD      Allergies    Patient has no known allergies.    Review of Systems   Review of Systems  Constitutional:  Negative for fever.  HENT:  Positive for dental problem. Negative for facial swelling.     Physical Exam Updated Vital Signs BP 117/69   Pulse 84   Temp 98.3 F (36.8 C)   Resp 16   LMP 05/07/2022   SpO2  98%  Physical Exam Vitals and nursing note reviewed.  Constitutional:      General: She is not in acute distress.    Appearance: She is well-developed. She is not toxic-appearing.  HENT:     Head: Normocephalic and atraumatic.     Right Ear: Tympanic membrane is not perforated, erythematous, retracted or bulging.     Left Ear: Tympanic membrane is not perforated, erythematous, retracted or bulging.     Nose: Nose normal.     Mouth/Throat:     Pharynx: Uvula midline. No oropharyngeal exudate, posterior oropharyngeal erythema or uvula swelling.     Tonsils: No tonsillar abscesses.     Comments: Hole in area of right third molar.  Multiple teeth removed.  Poor dentition overall.  No fluctuance noted. Eyes:     General:        Right eye: No discharge.        Left eye: No discharge.     Conjunctiva/sclera: Conjunctivae normal.  Pulmonary:     Effort: No respiratory distress.  Musculoskeletal:     Cervical back: Normal range of motion and neck supple.  Lymphadenopathy:     Cervical: No cervical adenopathy.  Neurological:     Mental Status: She is alert.     Comments: Clear speech.   Psychiatric:  Behavior: Behavior normal.        Thought Content: Thought content normal.     ED Results / Procedures / Treatments   Labs (all labs ordered are listed, but only abnormal results are displayed) Labs Reviewed - No data to display  EKG None  Radiology No results found.  Procedures Procedures    Medications Ordered in ED Medications  HYDROcodone-acetaminophen (NORCO/VICODIN) 5-325 MG per tablet 1 tablet (1 tablet Oral Given 06/10/22 2155)  amoxicillin-clavulanate (AUGMENTIN) 875-125 MG per tablet 1 tablet (1 tablet Oral Given 06/10/22 2155)    ED Course/ Medical Decision Making/ A&P                           Medical Decision Making Patient is 31 year old female, here for severe dental pain, and we reviewed her inspect she does have several Percocets that she has been  given lately, but has a appointment made for Thursday with her dentist for follow-up.  And suspicious for possible dry socket, her pain is intractable, I discussed the importance of follow-up with her dentist, and she understands that she will not be prescribed pain medication again in the ER.  Discussed importance of only eating liquid foods until being seen by dentist, to prevent further infection of the area.  Will put placed on Augmentin secondary to concerns for possible infection.  Discussed return precautions, she is afebrile, and does not have any facial swelling.  Risk Prescription drug management.   Final Clinical Impression(s) / ED Diagnoses Final diagnoses:  Pain, dental    Rx / DC Orders ED Discharge Orders          Ordered    oxyCODONE-acetaminophen (PERCOCET/ROXICET) 5-325 MG tablet  Every 6 hours PRN        06/10/22 2159    amoxicillin-clavulanate (AUGMENTIN) 875-125 MG tablet  Every 12 hours        06/10/22 2159              Ryenne Lynam, Harley [place], [place] 06/10/22 2205    Linwood Dibbles, MD 06/10/22 2326

## 2022-06-10 NOTE — ED Provider Triage Note (Signed)
Emergency Medicine Provider Triage Evaluation Note  Amy Decker , a 31 y.o. female  was evaluated in triage.  Pt complains of dental pain after having 20 teeth being pulled last week. Went to UC last week was told to have a bacterial infection in mouth, states clindamycin prescript didn't help. Can't eat or sleep d/t pain.  Review of Systems  Positive: Tooth pain Negative: fever  Physical Exam  BP 117/69   Pulse 84   Temp 98.3 F (36.8 C)   Resp 16   LMP 05/07/2022   SpO2 98%  Gen:   Awake, no distress   Resp:  Normal effort  MSK:   Moves extremities without difficulty  Other:  +hole/ulceration in 3rd molar of right lower mouth  Medical Decision Making  Medically screening exam initiated at 9:20 PM.  Appropriate orders placed.  Amy Decker was informed that the remainder of the evaluation will be completed by another provider, this initial triage assessment does not replace that evaluation, and the importance of remaining in the ED until their evaluation is complete.     [name], [place] 06/10/22 2122

## 2022-06-10 NOTE — ED Notes (Addendum)
Per Providers Alphonzo Severance, [place] NP, Tomi Bamberger, Lennox Laity NP) patient was seen last week and given all the resources that UC could offer. Per providers patient next steps is to f/u with her dentist for further evaluation. Spoke with patient and explained to her to call her dentist for a f/u appt. Pt states that she has a upcoming appt with them on Thursday. Pt states that  she finished the pain medication in one day and it did not help and the antibiotics was making  her nauseous. Pt was told to call and see if she could be placed on the cancellation list to be seen sooner if possible, patient states that she will give them a call. Also told patient that she does have the option to stay and be evaluated by a provider if she feels the need too, patient declined and states that she will call her dentist.Pt was not evaluated by a provider before she left.

## 2022-06-10 NOTE — Progress Notes (Signed)
Was on line with patient and discussing options that we could change antibiotic due to GI upset, add Ibuprofen 800mg  and viscous lidocaine to swish and spit for mouth pain. While discussing these options the call cut off. Provider waited for over 10 minutes for patient to log back in with no attempts made. Provider called patient with no answer. Will mark visit incomplete and no charge.

## 2022-06-10 NOTE — ED Triage Notes (Signed)
Pt had recent tooth extractions. Did go to UC. She did finish course of antibiotics.

## 2022-06-25 ENCOUNTER — Telehealth: Payer: Medicaid Other | Admitting: Physician Assistant

## 2022-06-25 DIAGNOSIS — J069 Acute upper respiratory infection, unspecified: Secondary | ICD-10-CM

## 2022-06-26 MED ORDER — BENZONATATE 100 MG PO CAPS: 100.0000 mg | ORAL_CAPSULE | Freq: Three times a day (TID) | ORAL | 0 refills | Status: AC | PRN

## 2022-06-26 MED ORDER — PROMETHAZINE-DM 6.25-15 MG/5ML PO SYRP: 5.0000 mL | ORAL_SOLUTION | Freq: Four times a day (QID) | ORAL | 0 refills | Status: AC | PRN

## 2022-06-26 MED ORDER — IPRATROPIUM BROMIDE 0.03 % NA SOLN: 2.0000 | Freq: Two times a day (BID) | NASAL | 0 refills | Status: AC

## 2022-06-26 NOTE — Progress Notes (Signed)
E-Visit for Upper Respiratory Infection   We are sorry you are not feeling well.  Here is how we plan to help!  Based on what you have shared with me, it looks like you may have a viral upper respiratory infection.  Upper respiratory infections are caused by a large number of viruses; however, rhinovirus is the most common cause.   Symptoms vary from person to person, with common symptoms including sore throat, cough, fatigue or lack of energy and feeling of general discomfort.  A low-grade fever of up to 100.4 may present, but is often uncommon.  Symptoms vary however, and are closely related to a person's age or underlying illnesses.  The most common symptoms associated with an upper respiratory infection are nasal discharge or congestion, cough, sneezing, headache and pressure in the ears and face.  These symptoms usually persist for about 3 to 10 days, but can last up to 2 weeks.  It is important to know that upper respiratory infections do not cause serious illness or complications in most cases.    Upper respiratory infections can be transmitted from person to person, with the most common method of transmission being a person's hands.  The virus is able to live on the skin and can infect other persons for up to 2 hours after direct contact.  Also, these can be transmitted when someone coughs or sneezes; thus, it is important to cover the mouth to reduce this risk.  To keep the spread of the illness at bay, good hand hygiene is very important.  This is an infection that is most likely caused by a virus. There are no specific treatments other than to help you with the symptoms until the infection runs its course.  We are sorry you are not feeling well.  Here is how we plan to help!   For nasal congestion, you may use an oral decongestants such as Mucinex D or if you have glaucoma or high blood pressure use plain Mucinex.  Saline nasal spray or nasal drops can help and can safely be used as often as  needed for congestion.  For your congestion, I have prescribed Ipratropium Bromide nasal spray 0.03% two sprays in each nostril 2-3 times a day  If you do not have a history of heart disease, hypertension, diabetes or thyroid disease, prostate/bladder issues or glaucoma, you may also use Sudafed to treat nasal congestion.  It is highly recommended that you consult with a pharmacist or your primary care physician to ensure this medication is safe for you to take.     If you have a cough, you may use cough suppressants such as Delsym and Robitussin.  If you have glaucoma or high blood pressure, you can also use Coricidin HBP.   For cough I have prescribed for you A prescription cough medication called Tessalon Perles 100 mg. You may take 1-2 capsules every 8 hours as needed for cough and Promethazine DM cough syrup Take 5 mL every 6 hours as needed for cough. Can use with tessalon.   If you have a sore or scratchy throat, use a saltwater gargle-  to  teaspoon of salt dissolved in a 4-ounce to 8-ounce glass of warm water.  Gargle the solution for approximately 15-30 seconds and then spit.  It is important not to swallow the solution.  You can also use throat lozenges/cough drops and Chloraseptic spray to help with throat pain or discomfort.  Warm or cold liquids can also be helpful in  relieving throat pain.  For headache, pain or general discomfort, you can use Ibuprofen or Tylenol as directed.   Some authorities believe that zinc sprays or the use of Echinacea may shorten the course of your symptoms.   HOME CARE Only take medications as instructed by your medical team. Be sure to drink plenty of fluids. Water is fine as well as fruit juices, sodas and electrolyte beverages. You may want to stay away from caffeine or alcohol. If you are nauseated, try taking small sips of liquids. How do you know if you are getting enough fluid? Your urine should be a pale yellow or almost colorless. Get rest. Taking  a steamy shower or using a humidifier may help nasal congestion and ease sore throat pain. You can place a towel over your head and breathe in the steam from hot water coming from a faucet. Using a saline nasal spray works much the same way. Cough drops, hard candies and sore throat lozenges may ease your cough. Avoid close contacts especially the very young and the elderly Cover your mouth if you cough or sneeze Always remember to wash your hands.   GET HELP RIGHT AWAY IF: You develop worsening fever. If your symptoms do not improve within 10 days You develop yellow or green discharge from your nose over 3 days. You have coughing fits You develop a severe head ache or visual changes. You develop shortness of breath, difficulty breathing or start having chest pain Your symptoms persist after you have completed your treatment plan  MAKE SURE YOU  Understand these instructions. Will watch your condition. Will get help right away if you are not doing well or get worse.  Thank you for choosing an e-visit.  Your e-visit answers were reviewed by a board certified advanced clinical practitioner to complete your personal care plan. Depending upon the condition, your plan could have included both over the counter or prescription medications.  Please review your pharmacy choice. Make sure the pharmacy is open so you can pick up prescription now. If there is a problem, you may contact your provider through [employer] and have the prescription routed to another pharmacy.  Your safety is important to [place]. If you have drug allergies check your prescription carefully.   For the next 24 hours you can use MyChart to ask questions about today's visit, request a non-urgent call back, or ask for a work or school excuse. You will get an email in the next two days asking about your experience. I hope that your e-visit has been valuable and will speed your recovery.  I have spent 5 minutes in review of  e-visit questionnaire, review and updating patient chart, medical decision making and response to patient.   Margaretann Loveless, PA-C

## 2022-07-03 ENCOUNTER — Telehealth: Payer: Medicaid Other | Admitting: Family Medicine

## 2022-07-03 DIAGNOSIS — J4540 Moderate persistent asthma, uncomplicated: Secondary | ICD-10-CM | POA: Diagnosis not present

## 2022-07-03 MED ORDER — AZITHROMYCIN 250 MG PO TABS: ORAL_TABLET | ORAL | 0 refills | Status: AC

## 2022-07-03 MED ORDER — PREDNISONE 20 MG PO TABS: 20.0000 mg | ORAL_TABLET | Freq: Two times a day (BID) | ORAL | 0 refills | Status: AC

## 2022-07-03 NOTE — Progress Notes (Signed)
We are sorry that you are not feeling well.  Here is how we plan to help!  Based on your presentation I believe you most likely have A cough due to bacteria.  When patients have a fever and a productive cough with a change in color or increased sputum production, we are concerned about bacterial bronchitis.  If left untreated it can progress to pneumonia.  If your symptoms do not improve with your treatment plan it is important that you contact your provider.   I have prescribed Azithromyin 250 mg: two tablets now and then one tablet daily for 4 additonal days  Prednisone 20 mg twice daily for 5 days.    In addition you may use     From your responses in the eVisit questionnaire you describe inflammation in the upper respiratory tract which is causing a significant cough.  This is commonly called Bronchitis and has four common causes:   Allergies Viral Infections Acid Reflux Bacterial Infection Allergies, viruses and acid reflux are treated by controlling symptoms or eliminating the cause. An example might be a cough caused by taking certain blood pressure medications. You stop the cough by changing the medication. Another example might be a cough caused by acid reflux. Controlling the reflux helps control the cough.  USE OF BRONCHODILATOR ("RESCUE") INHALERS: There is a risk from using your bronchodilator too frequently.  The risk is that over-reliance on a medication which only relaxes the muscles surrounding the breathing tubes can reduce the effectiveness of medications prescribed to reduce swelling and congestion of the tubes themselves.  Although you feel brief relief from the bronchodilator inhaler, your asthma may actually be worsening with the tubes becoming more swollen and filled with mucus.  This can delay other crucial treatments, such as oral steroid medications. If you need to use a bronchodilator inhaler daily, several times per day, you should discuss this with your provider.  There  are probably better treatments that could be used to keep your asthma under control.     HOME CARE Only take medications as instructed by your medical team. Complete the entire course of an antibiotic. Drink plenty of fluids and get plenty of rest. Avoid close contacts especially the very young and the elderly Cover your mouth if you cough or cough into your sleeve. Always remember to wash your hands A steam or ultrasonic humidifier can help congestion.   GET HELP RIGHT AWAY IF: You develop worsening fever. You become short of breath You cough up blood. Your symptoms persist after you have completed your treatment plan MAKE SURE YOU  Understand these instructions. Will watch your condition. Will get help right away if you are not doing well or get worse.    Thank you for choosing an e-visit.  Your e-visit answers were reviewed by a board certified advanced clinical practitioner to complete your personal care plan. Depending upon the condition, your plan could have included both over the counter or prescription medications.  Please review your pharmacy choice. Make sure the pharmacy is open so you can pick up prescription now. If there is a problem, you may contact your provider through [employer] and have the prescription routed to another pharmacy.  Your safety is important to [place]. If you have drug allergies check your prescription carefully.   For the next 24 hours you can use MyChart to ask questions about today's visit, request a non-urgent call back, or ask for a work or school excuse. You will get an  email in the next two days asking about your experience. I hope that your e-visit has been valuable and will speed your recovery.   I have provided 5 minutes of non face to face time during this encounter for chart review and documentation.

## 2022-07-18 ENCOUNTER — Telehealth: Payer: Medicaid Other | Admitting: Physician Assistant

## 2022-07-18 DIAGNOSIS — K6289 Other specified diseases of anus and rectum: Secondary | ICD-10-CM

## 2022-07-18 DIAGNOSIS — K921 Melena: Secondary | ICD-10-CM

## 2022-07-18 NOTE — Progress Notes (Signed)
Because you have never had any official diagnosis of hemorrhoids previously, I feel your condition warrants further evaluation and I recommend that you be seen in a face to face visit. It would be recommended for an exam to determine if a hemorrhoid is the source as well as consideration to have the hemorrhoid opened to remove any clotting that is causing swelling and pain. This is only true, and quick resolution for hemorrhoids.    NOTE: There will be NO CHARGE for this eVisit   If you are having a true medical emergency please call 911.      For an urgent face to face visit, [facility] has eight urgent care centers for your convenience:   NEW!! [facility] [place] [facility] [place] Get Driving Directions [phone] [address] [place], [place] [facility] [place] [facility] [place] Get Driving Directions [phone] [place], [place] [address]   [facility] [place] [facility]) Get Driving Directions [phone] [address], [place] [address]  [facility] [place] ([place]) Get Driving Directions [phone] [address] [place],  [place]  [address]  [facility] [place] [facility] at [address] [place] Get Driving Directions  [phone] [address] [place],  [place] [address]   [facility] Get Driving Directions [phone] [address], [place], [place] [address]   [facility] Get Driving Directions  [phone] [address] [place], [place] [address]   [facility] [place] Get Driving Directions [phone] [address] [place], [place] [address]  Your MyChart E-visit questionnaire answers were reviewed by a board certified advanced clinical practitioner to complete your  personal care plan based on your specific symptoms.  Thank you for using e-Visits.   I have spent 5 minutes in review of e-visit questionnaire, review and updating patient chart, medical decision making and response to patient.   Mar Daring, PA-C

## 2022-11-08 ENCOUNTER — Telehealth: Payer: Medicaid Other | Admitting: Physician Assistant

## 2022-11-08 DIAGNOSIS — R112 Nausea with vomiting, unspecified: Secondary | ICD-10-CM | POA: Diagnosis not present

## 2022-11-08 DIAGNOSIS — G43809 Other migraine, not intractable, without status migrainosus: Secondary | ICD-10-CM

## 2022-11-08 MED ORDER — ONDANSETRON HCL 4 MG PO TABS: 4.0000 mg | ORAL_TABLET | Freq: Three times a day (TID) | ORAL | 0 refills | Status: AC | PRN

## 2022-11-08 NOTE — Progress Notes (Signed)
E-Visit for Nausea and Vomiting   We are sorry that you are not feeling well. Here is how we plan to help!  Based on what you have shared with me it looks like you have a Virus that is irritating your GI tract.  Vomiting is the forceful emptying of a portion of the stomach's content through the mouth.  Although nausea and vomiting can make you feel miserable, it's important to remember that these are not diseases, but rather symptoms of an underlying illness.  When we treat short term symptoms, we always caution that any symptoms that persist should be fully evaluated in a medical office.  I have prescribed a medication that will help alleviate your symptoms and allow you to stay hydrated:  Zofran 4 mg 1 tablet every 8 hours as needed for nausea and vomiting  HOME CARE: Drink clear liquids.  This is very important! Dehydration (the lack of fluid) can lead to a serious complication.  Start off with 1 tablespoon every 5 minutes for 8 hours. You may begin eating bland foods after 8 hours without vomiting.  Start with saltine crackers, white bread, rice, mashed potatoes, applesauce. After 48 hours on a bland diet, you may resume a normal diet. Try to go to sleep.  Sleep often empties the stomach and relieves the need to vomit.  GET HELP RIGHT AWAY IF:  Your symptoms do not improve or worsen within 2 days after treatment. You have a fever for over 3 days. You cannot keep down fluids after trying the medication.  MAKE SURE YOU:  Understand these instructions. Will watch your condition. Will get help right away if you are not doing well or get worse.    Thank you for choosing an e-visit.  Your e-visit answers were reviewed by a board certified advanced clinical practitioner to complete your personal care plan. Depending upon the condition, your plan could have included both over the counter or prescription medications.  Please review your pharmacy choice. Make sure the pharmacy is open so  you can pick up prescription now. If there is a problem, you may contact your provider through MyChart messaging and have the prescription routed to another pharmacy.  Your safety is important to us. If you have drug allergies check your prescription carefully.   For the next 24 hours you can use MyChart to ask questions about today's visit, request a non-urgent call back, or ask for a work or school excuse. You will get an email in the next two days asking about your experience. I hope that your e-visit has been valuable and will speed your recovery.  I have spent 5 minutes in review of e-visit questionnaire, review and updating patient chart, medical decision making and response to patient.   Genavie Boettger M Mayzee Reichenbach, PA-C  

## 2022-11-20 NOTE — Progress Notes (Signed)
Patient ID: Amy Decker, female    DOB: 1991/01/10  MRN: [id number]  CC: Hemorrhoids  Subjective: Amy Decker is a 32 y.o. female who presents for hemorrhoids.   Her concerns today include:  - Reports hemorrhoids x 2 years and worsening. Thinks hemorrhoid may be internal. Endorses pain with sitting, constipation, and bleeding with bowel movements. Reports childbirth x 3 may have contributed to hemorrhoids. She has tried several over-the-counter medications with no relief.  - Reports anxiety depression. Reports unsure of primary cause. Reports has been seen by several therapist who tried "different medications" which were ineffective. She is not ready to trial a different medication today Requests referral to therapist. She denies thoughts of self-harm, suicidal ideations, homicidal ideations. - No further issues/concerns for discussion today.   There are no problems to display for this patient.    Current Outpatient Medications on File Prior to Visit  Medication Sig Dispense Refill   benzonatate (TESSALON) 100 MG capsule Take 1 capsule (100 mg total) by mouth 3 (three) times daily as needed. (Patient not taking: Reported on 11/27/2022) 30 capsule 0   ipratropium (ATROVENT) 0.03 % nasal spray Place 2 sprays into both nostrils every 12 (twelve) hours. (Patient not taking: Reported on 11/27/2022) 30 mL 0   ondansetron (ZOFRAN) 4 MG tablet Take 1 tablet (4 mg total) by mouth every 8 (eight) hours as needed for nausea or vomiting. (Patient not taking: Reported on 11/27/2022) 20 tablet 0   oxyCODONE-acetaminophen (PERCOCET/ROXICET) 5-325 MG tablet Take 1 tablet by mouth every 6 (six) hours as needed for severe pain. (Patient not taking: Reported on 11/27/2022) 10 tablet 0   promethazine-dextromethorphan (PROMETHAZINE-DM) 6.25-15 MG/5ML syrup Take 5 mLs by mouth 4 (four) times daily as needed. (Patient not taking: Reported on 11/27/2022) 118 mL 0   No current facility-administered  medications on file prior to visit.    No Known Allergies  Social History   Socioeconomic History   Marital status: Significant Other    Spouse name: Not on file   Number of children: Not on file   Years of education: Not on file   Highest education level: Not on file  Occupational History   Occupation: [employer]  Tobacco Use   Smoking status: Former    Types: Cigarettes   Smokeless tobacco: Never  Vaping Use   Vaping Use: Every day  Substance and Sexual Activity   Alcohol use: Never   Drug use: Never   Sexual activity: Yes  Other Topics Concern   Not on file  Social History Narrative   Not on file   Social Determinants of Health   Financial Resource Strain: Not on file  Food Insecurity: Not on file  Transportation Needs: Not on file  Physical Activity: Not on file  Stress: Not on file  Social Connections: Not on file  Intimate Partner Violence: Not on file    Family History  Problem Relation Age of Onset   Arthritis Mother    Cancer Mother    Hypertension Maternal Grandfather     Past Surgical History:  Procedure Laterality Date   CHOLECYSTECTOMY     TONSILLECTOMY      ROS: Review of Systems Negative except as stated above  PHYSICAL EXAM: BP 104/69 (BP Location: Left Arm, Patient Position: Sitting, Cuff Size: Normal)   Pulse 80   Temp 98 F (36.7 C)   Resp 12   Ht 5\' 5"  (1.651 m)   Wt 97 lb 3.2 oz (44.1 kg)  LMP 11/04/2022   SpO2 99%   BMI 16.17 kg/m   Physical Exam HENT:     Head: Normocephalic and atraumatic.  Eyes:     Extraocular Movements: Extraocular movements intact.     Conjunctiva/sclera: Conjunctivae normal.     Pupils: Pupils are equal, round, and reactive to light.  Cardiovascular:     Rate and Rhythm: Normal rate and regular rhythm.     Pulses: Normal pulses.     Heart sounds: Normal heart sounds.  Pulmonary:     Effort: Pulmonary effort is normal.     Breath sounds: Normal breath sounds.  Genitourinary:     Rectum: External hemorrhoid present.     Comments: Sabino Snipes, CMA present. Musculoskeletal:     Cervical back: Normal range of motion and neck supple.  Neurological:     General: No focal deficit present.     Mental Status: She is alert and oriented to person, place, and time.  Psychiatric:        Mood and Affect: Mood normal.        Behavior: Behavior normal.     ASSESSMENT AND PLAN: 1. Hemorrhoids, unspecified hemorrhoid type 2. Constipation, unspecified constipation type 3. Blood in stool -  Hydrocortisone suppository and Docusate Sodium as prescribed. Counseled on medication adherence/adverse effects. - Referral to Gastroenterology for further evaluation/management. During the interim follow-up with primary provider as scheduled.  - Ambulatory referral to Gastroenterology - docusate sodium (COLACE) 100 MG capsule; Take 1 capsule (100 mg total) by mouth daily as needed for mild constipation.  Dispense: 30 capsule; Refill: 1 - hydrocortisone (ANUSOL-HC) 25 MG suppository; Place 1 suppository (25 mg total) rectally 2 (two) times daily as needed for hemorrhoids or anal itching.  Dispense: 24 suppository; Refill: 0  4. Anxiety and depression - Patient denies thoughts of self-harm, suicidal ideations, homicidal ideations. - Patient declined pharmacological therapy. - Referral to Psychiatry for further evaluation/management. During the interim follow-up with primary provider as scheduled.  - Ambulatory referral to Psychiatry   Patient was given the opportunity to ask questions.  Patient verbalized understanding of the plan and was able to repeat key elements of the plan. Patient was given clear instructions to go to Emergency Department or return to medical center if symptoms don't improve, worsen, or new problems develop.The patient verbalized understanding.   Orders Placed This Encounter  Procedures   Ambulatory referral to Gastroenterology   Ambulatory referral to Psychiatry      Requested Prescriptions   Signed Prescriptions Disp Refills   docusate sodium (COLACE) 100 MG capsule 30 capsule 1    Sig: Take 1 capsule (100 mg total) by mouth daily as needed for mild constipation.   hydrocortisone (ANUSOL-HC) 25 MG suppository 24 suppository 0    Sig: Place 1 suppository (25 mg total) rectally 2 (two) times daily as needed for hemorrhoids or anal itching.    Return in about 4 weeks (around 12/25/2022) for Follow-Up or next available Georganna Skeans, MD.  Rema Fendt, NP

## 2022-11-27 ENCOUNTER — Ambulatory Visit (INDEPENDENT_AMBULATORY_CARE_PROVIDER_SITE_OTHER): Payer: Medicaid Other | Admitting: Family

## 2022-11-27 ENCOUNTER — Encounter: Payer: Self-pay | Admitting: Family

## 2022-11-27 VITALS — BP 104/69 | HR 80 | Temp 98.0°F | Resp 12 | Ht 65.0 in | Wt 97.2 lb

## 2022-11-27 DIAGNOSIS — F32A Depression, unspecified: Secondary | ICD-10-CM

## 2022-11-27 DIAGNOSIS — K5900 Constipation, unspecified: Secondary | ICD-10-CM | POA: Diagnosis not present

## 2022-11-27 DIAGNOSIS — K649 Unspecified hemorrhoids: Secondary | ICD-10-CM | POA: Diagnosis not present

## 2022-11-27 DIAGNOSIS — F419 Anxiety disorder, unspecified: Secondary | ICD-10-CM

## 2022-11-27 DIAGNOSIS — K921 Melena: Secondary | ICD-10-CM | POA: Diagnosis not present

## 2022-11-27 MED ORDER — DOCUSATE SODIUM 100 MG PO CAPS: 100.0000 mg | ORAL_CAPSULE | Freq: Every day | ORAL | 1 refills | Status: AC | PRN

## 2022-11-27 MED ORDER — HYDROCORTISONE ACETATE 25 MG RE SUPP: 25.0000 mg | Freq: Two times a day (BID) | RECTAL | 0 refills | Status: AC | PRN

## 2022-11-27 NOTE — Progress Notes (Signed)
Pt is here for hemorrhoids   Complaining of a possible internal hemorrhoid, bleeding with bowel movements and pain  for X2 years   Pt states she has tried several different OTC treatments with no relief

## 2022-12-25 ENCOUNTER — Encounter: Payer: Medicaid Other | Admitting: Family

## 2022-12-25 NOTE — Progress Notes (Signed)
Erroneous encounter-disregard

## 2023-01-21 ENCOUNTER — Encounter: Payer: Self-pay | Admitting: Family
